# Patient Record
Sex: Female | Born: 1980 | Race: White | Hispanic: No | Marital: Married | Smoking: Never smoker
Health system: Southern US, Community
[De-identification: ages and names within clinical notes are randomized; demographics above are authoritative.]

## PROBLEM LIST (undated history)

## (undated) DIAGNOSIS — F32A Depression, unspecified: Secondary | ICD-10-CM

## (undated) DIAGNOSIS — Z8742 Personal history of other diseases of the female genital tract: Secondary | ICD-10-CM

## (undated) DIAGNOSIS — O24419 Gestational diabetes mellitus in pregnancy, unspecified control: Secondary | ICD-10-CM

## (undated) DIAGNOSIS — F329 Major depressive disorder, single episode, unspecified: Secondary | ICD-10-CM

## (undated) DIAGNOSIS — F419 Anxiety disorder, unspecified: Secondary | ICD-10-CM

## (undated) DIAGNOSIS — I10 Essential (primary) hypertension: Secondary | ICD-10-CM

## (undated) DIAGNOSIS — Z3490 Encounter for supervision of normal pregnancy, unspecified, unspecified trimester: Secondary | ICD-10-CM

## (undated) DIAGNOSIS — R7302 Impaired glucose tolerance (oral): Secondary | ICD-10-CM

## (undated) HISTORY — DX: Personal history of other diseases of the female genital tract: Z87.42

## (undated) HISTORY — DX: Major depressive disorder, single episode, unspecified: F32.9

## (undated) HISTORY — DX: Anxiety disorder, unspecified: F41.9

## (undated) HISTORY — DX: Impaired glucose tolerance (oral): R73.02

## (undated) HISTORY — PX: NO PAST SURGERIES: SHX2092

## (undated) HISTORY — DX: Depression, unspecified: F32.A

## (undated) HISTORY — DX: Morbid (severe) obesity due to excess calories: E66.01

---
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
## 2002-09-15 ENCOUNTER — Other Ambulatory Visit: Admission: RE | Admit: 2002-09-15 | Discharge: 2002-09-15 | Payer: Self-pay | Admitting: Obstetrics and Gynecology

## 2003-05-12 ENCOUNTER — Encounter: Payer: Self-pay | Admitting: Family Medicine

## 2003-05-12 ENCOUNTER — Encounter: Admission: RE | Admit: 2003-05-12 | Discharge: 2003-05-12 | Payer: Self-pay | Admitting: Family Medicine

## 2003-09-27 ENCOUNTER — Other Ambulatory Visit: Admission: RE | Admit: 2003-09-27 | Discharge: 2003-09-27 | Payer: Self-pay | Admitting: Obstetrics and Gynecology

## 2004-12-10 ENCOUNTER — Other Ambulatory Visit: Admission: RE | Admit: 2004-12-10 | Discharge: 2004-12-10 | Payer: Self-pay | Admitting: Obstetrics and Gynecology

## 2005-09-13 ENCOUNTER — Emergency Department (HOSPITAL_COMMUNITY): Admission: EM | Admit: 2005-09-13 | Discharge: 2005-09-13 | Payer: Self-pay | Admitting: Family Medicine

## 2005-09-21 ENCOUNTER — Emergency Department (HOSPITAL_COMMUNITY): Admission: EM | Admit: 2005-09-21 | Discharge: 2005-09-21 | Payer: Self-pay | Admitting: Family Medicine

## 2006-09-17 ENCOUNTER — Other Ambulatory Visit: Admission: RE | Admit: 2006-09-17 | Discharge: 2006-09-17 | Payer: Self-pay | Admitting: Obstetrics & Gynecology

## 2006-12-06 ENCOUNTER — Emergency Department (HOSPITAL_COMMUNITY): Admission: EM | Admit: 2006-12-06 | Discharge: 2006-12-06 | Payer: Self-pay | Admitting: Emergency Medicine

## 2007-07-02 ENCOUNTER — Emergency Department (HOSPITAL_COMMUNITY): Admission: EM | Admit: 2007-07-02 | Discharge: 2007-07-02 | Payer: Self-pay | Admitting: Family Medicine

## 2008-01-14 ENCOUNTER — Other Ambulatory Visit: Admission: RE | Admit: 2008-01-14 | Discharge: 2008-01-14 | Payer: Self-pay | Admitting: Obstetrics and Gynecology

## 2008-05-27 ENCOUNTER — Emergency Department (HOSPITAL_COMMUNITY): Admission: EM | Admit: 2008-05-27 | Discharge: 2008-05-27 | Payer: Self-pay | Admitting: Emergency Medicine

## 2011-12-08 LAB — HM PAP SMEAR

## 2013-02-15 ENCOUNTER — Other Ambulatory Visit: Payer: Self-pay | Admitting: *Deleted

## 2013-04-08 ENCOUNTER — Telehealth: Payer: Self-pay | Admitting: Nurse Practitioner

## 2013-04-08 NOTE — Telephone Encounter (Signed)
Left message on CB#. Instructed patient to call provider on call or go to urgent care. Please call our office on Monday if need appointment.

## 2013-04-08 NOTE — Telephone Encounter (Signed)
Irregular periods and sever cramping.

## 2013-09-19 ENCOUNTER — Encounter: Payer: Self-pay | Admitting: Nurse Practitioner

## 2013-12-16 ENCOUNTER — Ambulatory Visit: Payer: Self-pay | Admitting: Nurse Practitioner

## 2013-12-19 ENCOUNTER — Encounter: Payer: Self-pay | Admitting: Nurse Practitioner

## 2013-12-20 ENCOUNTER — Ambulatory Visit (INDEPENDENT_AMBULATORY_CARE_PROVIDER_SITE_OTHER): Payer: BC Managed Care – PPO | Admitting: Nurse Practitioner

## 2013-12-20 ENCOUNTER — Encounter: Payer: Self-pay | Admitting: Nurse Practitioner

## 2013-12-20 VITALS — BP 122/80 | HR 80 | Ht 70.0 in | Wt 265.0 lb

## 2013-12-20 DIAGNOSIS — Z113 Encounter for screening for infections with a predominantly sexual mode of transmission: Secondary | ICD-10-CM

## 2013-12-20 DIAGNOSIS — Z01419 Encounter for gynecological examination (general) (routine) without abnormal findings: Secondary | ICD-10-CM

## 2013-12-20 DIAGNOSIS — E282 Polycystic ovarian syndrome: Secondary | ICD-10-CM

## 2013-12-20 DIAGNOSIS — N6320 Unspecified lump in the left breast, unspecified quadrant: Secondary | ICD-10-CM

## 2013-12-20 DIAGNOSIS — N63 Unspecified lump in unspecified breast: Secondary | ICD-10-CM

## 2013-12-20 DIAGNOSIS — Z0000 Encounter for general adult medical examination without abnormal findings: Secondary | ICD-10-CM

## 2013-12-20 LAB — POCT URINALYSIS DIPSTICK
Bilirubin, UA: NEGATIVE
Glucose, UA: NEGATIVE
Ketones, UA: NEGATIVE
Leukocytes, UA: NEGATIVE
Nitrite, UA: NEGATIVE
Protein, UA: NEGATIVE
Urobilinogen, UA: NEGATIVE
pH, UA: 5

## 2013-12-20 LAB — HEMOGLOBIN, FINGERSTICK: Hemoglobin, fingerstick: 13.8 g/dL (ref 12.0–16.0)

## 2013-12-20 MED ORDER — NORETHIN ACE-ETH ESTRAD-FE 1-20 MG-MCG PO TABS: 1.0000 | ORAL_TABLET | Freq: Every day | ORAL | Status: DC

## 2013-12-20 NOTE — Progress Notes (Signed)
Patient ID: Tina Diaz, female   DOB: 09-08-1981, 33 y.o.   MRN: [id number] 33 y.o. G0P0 Single Caucasian Fe here for annual exam. Same partner for 9 months but not SA. Menses about 28 - 31 days, flow is moderate to light.  Some cramps every other month.  No PMS.  In past did not do well on Mircette. She would like other test for PCOS - she has not had a TSH done and would like this ordered.  Patient's last menstrual period was 12/19/2013.          Sexually active: yes  The current method of family planning is OCP (estrogen/progesterone).    Exercising: yes  Yoga and cardio 3 times per week Smoker:  no  Health Maintenance: Pap:  12/08/11, WNL TDaP:  2008 Labs:  HB:  13.8 Urine:  Trace RBC (on menses)   reports that she has never smoked. She has never used smokeless tobacco. She reports that she drinks alcohol. She reports that she does not use illicit drugs.  Past Medical History  Diagnosis Date  . History of PCOS     with hirtsutism    No past surgical history on file.  Current Outpatient Prescriptions  Medication Sig Dispense Refill  . norethindrone-ethinyl estradiol (LARIN FE 1/20) 1-20 MG-MCG tablet Take 1 tablet by mouth daily.  3 Package  3   No current facility-administered medications for this visit.    Family History  Problem Relation Age of Onset  . Diabetes Father   . Hypertension Father   . Ovarian cancer Maternal Grandmother 40  . Heart disease Maternal Grandfather   . Diabetes Paternal Grandmother   . Diabetes Paternal Grandfather     ROS:  Pertinent items are noted in HPI.  Otherwise, a comprehensive ROS was negative.  Exam:   BP 122/80  Pulse 80  Ht 5\' 10"  (1.778 m)  Wt 265 lb (120.203 kg)  BMI 38.02 kg/m2  LMP 12/19/2013 Height: 5\' 10"  (177.8 cm)  Ht Readings from Last 3 Encounters:  12/20/13 5\' 10"  (1.778 m)    General appearance: alert, cooperative and appears stated age Head: Normocephalic, without obvious abnormality, atraumatic Neck:  no adenopathy, supple, symmetrical, trachea midline and thyroid normal to inspection and palpation Lungs: clear to auscultation bilaterally Breasts: normal appearance, no masses or tenderness, positive findings: fibrocystic changes and 2 cm, irregular, firm and mobile nodule located on the left 1 inch from the left nipple at 4:30 position Heart: regular rate and rhythm Abdomen: soft, non-tender; no masses,  no organomegaly Extremities: extremities normal, atraumatic, no cyanosis or edema Skin: Skin color, texture, turgor normal. No rashes or lesions Lymph nodes: Cervical, supraclavicular, and axillary nodes normal. No abnormal inguinal nodes palpated Neurologic: Grossly normal   Pelvic: External genitalia:  no lesions              Urethra:  normal appearing urethra with no masses, tenderness or lesions              Bartholin's and Skene's: normal                 Vagina: normal appearing vagina with normal color and discharge, no lesions              Cervix: anteverted              Pap taken: yes Bimanual Exam:  Uterus:  normal size, contour, position, consistency, mobility, non-tender  Adnexa: no mass, fullness, tenderness               Rectovaginal: Confirms               Anus:  normal sphincter tone, no lesions  A:  Well Woman with normal exam  Contraception with OCP  R/O STD's  Mass left breast which may be a cluster of FCB changes at 4:30 position left breast.  P:   Pap smear as per guidelines Done today  Mammogram diagnostic or [place] is scheduled  Refill OCP - Loestrin for a year  Call with STD's, and TSH - next year will come in fasting and will check a fasting insulin level.  Counseled on breast self exam, STD prevention, use and side effects of OCP's, adequate intake of calcium and vitamin D, diet and exercise return annually or prn  An After Visit Summary was printed and given to the patient.

## 2013-12-20 NOTE — Progress Notes (Signed)
Encounter reviewed by Dr. Elaine Roanhorse Silva.  

## 2013-12-20 NOTE — Patient Instructions (Signed)

## 2013-12-21 LAB — STD PANEL
HIV: NONREACTIVE
Hepatitis B Surface Ag: NEGATIVE

## 2013-12-21 LAB — TSH: TSH: 1.43 u[IU]/mL (ref 0.350–4.500)

## 2013-12-23 LAB — IPS PAP TEST WITH REFLEX TO HPV

## 2013-12-23 LAB — IPS N GONORRHOEA AND CHLAMYDIA BY PCR

## 2013-12-26 ENCOUNTER — Telehealth: Payer: Self-pay | Admitting: Nurse Practitioner

## 2013-12-26 NOTE — Telephone Encounter (Signed)
Patient states she is returning Tina Diaz's call. No open phone note so routing call to triage.

## 2013-12-26 NOTE — Telephone Encounter (Signed)
Spoke with patient. Message from AshlandPatricia Rolen-Grubb, FNP as seen below given. Patient verbalizes understanding and is agreeable.  Routing to provider for final review. Patient agreeable to disposition. Will close encounter     Notes Recorded by Luisa DagoStephanie C Phillips, CMA on 12/26/2013 at 10:04 AM I have attempted to contact this patient by phone with the following results: left message to return my call on answering machine (home/mobile per DPR). 02 Recall entered 12/21/14 ------  Notes Recorded by Lauro FranklinPatricia Rolen-Grubb, FNP on 12/26/2013 at 7:58 AM Let patietn know that all STD's with GC, Chl, HIV,STS, Hep B are negative. Her TSH is also normal. And pap is normal 02

## 2013-12-28 ENCOUNTER — Ambulatory Visit
Admission: RE | Admit: 2013-12-28 | Discharge: 2013-12-28 | Disposition: A | Discharge status: Home / Self Care | Payer: BC Managed Care – PPO | Source: Ambulatory Visit | Attending: Nurse Practitioner | Admitting: Nurse Practitioner

## 2013-12-28 DIAGNOSIS — N6320 Unspecified lump in the left breast, unspecified quadrant: Secondary | ICD-10-CM

## 2014-10-30 ENCOUNTER — Other Ambulatory Visit: Payer: Self-pay | Admitting: *Deleted

## 2014-10-30 MED ORDER — NORETHIN ACE-ETH ESTRAD-FE 1-20 MG-MCG PO TABS: 1.0000 | ORAL_TABLET | Freq: Every day | ORAL | Status: DC

## 2014-10-30 NOTE — Telephone Encounter (Signed)
Medication refill request: Junel 1/20  Last AEX:  12/20/13 with Ms. Patty  Next AEX: 12/26/14 with Ms. Patty Last MMG (if hormonal medication request): MMG/Ultrasound 12/28/13 Bi-Rads 1: Negative Refill authorized: #3 packs

## 2014-12-26 ENCOUNTER — Ambulatory Visit: Payer: BC Managed Care – PPO | Admitting: Nurse Practitioner

## 2014-12-26 ENCOUNTER — Encounter: Payer: Self-pay | Admitting: Nurse Practitioner

## 2015-01-26 ENCOUNTER — Other Ambulatory Visit: Payer: Self-pay | Admitting: Nurse Practitioner

## 2015-01-26 MED ORDER — NORETHIN ACE-ETH ESTRAD-FE 1-20 MG-MCG PO TABS: 1.0000 | ORAL_TABLET | Freq: Every day | ORAL | Status: DC

## 2015-01-26 NOTE — Telephone Encounter (Signed)
Patient is requesting a refill of birth control. Pharmacy on file.

## 2015-01-26 NOTE — Telephone Encounter (Signed)
Medication refill request: OCP Last AEX:  12/20/13 PG Next AEX: 02/08/15 PG Last MMG (if hormonal medication request): 12/28/13 US Left BIRADS1:Neg Refill authorized: 10/30/14 #3packs/0R. Today #1pack/0R?

## 2015-02-08 ENCOUNTER — Encounter: Payer: Self-pay | Admitting: Nurse Practitioner

## 2015-02-08 ENCOUNTER — Ambulatory Visit (INDEPENDENT_AMBULATORY_CARE_PROVIDER_SITE_OTHER): Payer: BC Managed Care – PPO | Admitting: Nurse Practitioner

## 2015-02-08 VITALS — BP 122/76 | HR 84 | Ht 70.25 in | Wt 290.0 lb

## 2015-02-08 DIAGNOSIS — Z01419 Encounter for gynecological examination (general) (routine) without abnormal findings: Secondary | ICD-10-CM

## 2015-02-08 DIAGNOSIS — E282 Polycystic ovarian syndrome: Secondary | ICD-10-CM

## 2015-02-08 DIAGNOSIS — N912 Amenorrhea, unspecified: Secondary | ICD-10-CM | POA: Diagnosis not present

## 2015-02-08 DIAGNOSIS — Z0000 Encounter for general adult medical examination without abnormal findings: Secondary | ICD-10-CM

## 2015-02-08 LAB — POCT URINALYSIS DIPSTICK
Bilirubin, UA: NEGATIVE
Blood, UA: NEGATIVE
Glucose, UA: NEGATIVE
Ketones, UA: NEGATIVE
Leukocytes, UA: NEGATIVE
Nitrite, UA: NEGATIVE
Protein, UA: NEGATIVE
Urobilinogen, UA: NEGATIVE
pH, UA: 6

## 2015-02-08 LAB — HEMOGLOBIN, FINGERSTICK: Hemoglobin, fingerstick: 13.6 g/dL (ref 12.0–16.0)

## 2015-02-08 LAB — POCT URINE PREGNANCY: Preg Test, Ur: NEGATIVE

## 2015-02-08 MED ORDER — MEDROXYPROGESTERONE ACETATE 10 MG PO TABS: 10.0000 mg | ORAL_TABLET | Freq: Every day | ORAL | Status: DC

## 2015-02-08 MED ORDER — IBUPROFEN 800 MG PO TABS
800.0000 mg | ORAL_TABLET | Freq: Three times a day (TID) | ORAL | Status: DC | PRN
Start: 2015-02-08 — End: 2017-01-07

## 2015-02-08 MED ORDER — NORETHIN ACE-ETH ESTRAD-FE 1-20 MG-MCG PO TABS: 1.0000 | ORAL_TABLET | Freq: Every day | ORAL | Status: DC

## 2015-02-08 NOTE — Progress Notes (Signed)
Patient ID: Tina Diaz, female   DOB: 09-19-1981, 34 y.o.   MRN: [id number] 34 y.o. G0P0 Single  Caucasian Fe here for annual exam.  Had LMP 12/08/14, she then had several illness and deaths in the family.  She was with MGM who was with Hospice care for a month with biliary cancer before she passed.  Then paternal aunt died suddenly age 32 of ? heart or CVA.  She has been on her OCP and compliant to use.  After this last pack of pills did not restart new pack and is waiting for menses to start to start a new pack.  Not SA in a year. She would like to have more test done for PCOS - she did not return for fasting insulin last year.  Patient's last menstrual period was 12/08/2014 (exact date).          Sexually active:  No. The current method of family planning is OCP (estrogen/progesterone).    Exercising: Yes.    walking and weights Smoker:  no  Health Maintenance: Pap:  12/20/13, negative MMG:  12/28/13 diagnostic with ultrasound, Bi-Rads 1:  Negative, screening MMG at age 34 TDaP:  2008 Labs:  HB:  13.6  Urine:  Negative   UPT: neg   reports that she has never smoked. She has never used smokeless tobacco. She reports that she drinks alcohol. She reports that she does not use illicit drugs.  Past Medical History  Diagnosis Date  . History of PCOS     with hirtsutism    History reviewed. No pertinent past surgical history.  Current Outpatient Prescriptions  Medication Sig Dispense Refill  . norethindrone-ethinyl estradiol (LARIN FE 1/20) 1-20 MG-MCG tablet Take 1 tablet by mouth daily. 3 Package 3  . sertraline (ZOLOFT) 50 MG tablet Take 50 mg by mouth daily.    Marland Kitchen. ibuprofen (ADVIL,MOTRIN) 800 MG tablet Take 1 tablet (800 mg total) by mouth every 8 (eight) hours as needed. 30 tablet 1  . medroxyPROGESTERone (PROVERA) 10 MG tablet Take 1 tablet (10 mg total) by mouth daily. 10 tablet 0   No current facility-administered medications for this visit.    Family History  Problem  Relation Age of Onset  . Diabetes Father   . Hypertension Father   . Ovarian cancer Maternal Grandmother 40  . Cancer Maternal Grandmother     bile duct  . Heart disease Maternal Grandfather   . Diabetes Paternal Grandmother   . Diabetes Paternal Grandfather     ROS:  Pertinent items are noted in HPI.  Otherwise, a comprehensive ROS was negative.  Exam:   BP 122/76 mmHg  Pulse 84  Ht 5' 10.25" (1.784 m)  Wt 290 lb (131.543 kg)  BMI 41.33 kg/m2  LMP 12/08/2014 (Exact Date) Height: 5' 10.25" (178.4 cm) Ht Readings from Last 3 Encounters:  02/08/15 5' 10.25" (1.784 m)  12/20/13 5\' 10"  (1.778 m)    General appearance: alert, cooperative and appears stated age Head: Normocephalic, without obvious abnormality, atraumatic Neck: no adenopathy, supple, symmetrical, trachea midline and thyroid normal to inspection and palpation Lungs: clear to auscultation bilaterally Breasts: normal appearance, no masses or tenderness Heart: regular rate and rhythm Abdomen: soft, non-tender; no masses,  no organomegaly Extremities: extremities normal, atraumatic, no cyanosis or edema Skin: Skin color, texture, turgor normal. No rashes or lesions Lymph nodes: Cervical, supraclavicular, and axillary nodes normal. No abnormal inguinal nodes palpated Neurologic: Grossly normal   Pelvic: External genitalia:  no lesions  Urethra:  normal appearing urethra with no masses, tenderness or lesions              Bartholin's and Skene's: normal                 Vagina: normal appearing vagina with normal color and discharge, no lesions              Cervix: anteverted              Pap taken: No. Bimanual Exam:  Uterus:  normal size, contour, position, consistency, mobility, non-tender              Adnexa: no mass, fullness, tenderness               Rectovaginal: Confirms               Anus:  normal sphincter tone, no lesions  Chaperone present:  yes  A:  Well Woman with normal  exam  Contraception with OCP with current amenorrhea off OCP  History of PCOS  P:   Reviewed health and wellness pertinent to exam  Pap smear not taken today  Will start her on Provera challenge to initiate a withdrawal bleed then restart OCP  Will follow with fasting labs when she returns  Counseled on breast self exam, use and side effects of OCP's, adequate intake of calcium and vitamin D, diet and exercise return annually or prn  An After Visit Summary was printed and given to the patient.

## 2015-02-08 NOTE — Patient Instructions (Signed)

## 2015-02-18 NOTE — Progress Notes (Signed)
Encounter reviewed by Dr. Winry Egnew Silva.  

## 2015-02-22 ENCOUNTER — Other Ambulatory Visit (INDEPENDENT_AMBULATORY_CARE_PROVIDER_SITE_OTHER): Payer: BC Managed Care – PPO

## 2015-02-22 DIAGNOSIS — E282 Polycystic ovarian syndrome: Secondary | ICD-10-CM

## 2015-02-23 ENCOUNTER — Telehealth: Payer: Self-pay | Admitting: *Deleted

## 2015-02-23 LAB — COMPREHENSIVE METABOLIC PANEL
ALT: 17 U/L (ref 0–35)
AST: 19 U/L (ref 0–37)
Albumin: 3.9 g/dL (ref 3.5–5.2)
Alkaline Phosphatase: 75 U/L (ref 39–117)
BUN: 12 mg/dL (ref 6–23)
CO2: 24 mEq/L (ref 19–32)
Calcium: 9.1 mg/dL (ref 8.4–10.5)
Chloride: 104 mEq/L (ref 96–112)
Creat: 0.75 mg/dL (ref 0.50–1.10)
Glucose, Bld: 104 mg/dL — ABNORMAL HIGH (ref 70–99)
Potassium: 4.4 mEq/L (ref 3.5–5.3)
Sodium: 137 mEq/L (ref 135–145)
Total Bilirubin: 1 mg/dL (ref 0.2–1.2)
Total Protein: 6.8 g/dL (ref 6.0–8.3)

## 2015-02-23 LAB — LIPID PANEL
Cholesterol: 177 mg/dL (ref 0–200)
HDL: 68 mg/dL (ref >=46)
LDL Cholesterol: 93 mg/dL (ref 0–99)
Total CHOL/HDL Ratio: 2.6 Ratio
Triglycerides: 78 mg/dL (ref <150)
VLDL: 16 mg/dL (ref 0–40)

## 2015-02-23 LAB — VITAMIN D 25 HYDROXY (VIT D DEFICIENCY, FRACTURES): Vit D, 25-Hydroxy: 27 ng/mL — ABNORMAL LOW (ref 30–100)

## 2015-02-23 LAB — INSULIN, FASTING: Insulin fasting, serum: 6.9 u[IU]/mL (ref 2.0–19.6)

## 2015-02-23 NOTE — Telephone Encounter (Signed)
-----   Message from Ria CommentPatricia Grubb, FNP sent at 02/23/2015  8:08 AM EDT ----- Let pt. Know that Lipid panel is normal.  The CMP is normal except for slight elevated glucose but the fasting insulin was very normal.  The Vit D level was low and needs to start Vit D per protocol.  While I still believe she has PCOS the labs look very good and from a metabolic view she is not at risk for any targeted organ damage at this time ie: ( liver, kidney, pancreas ad heart)

## 2015-02-23 NOTE — Telephone Encounter (Signed)
Left Message To Call Back  

## 2015-02-23 NOTE — Telephone Encounter (Signed)
Patient notified- see lab results 

## 2015-03-19 ENCOUNTER — Telehealth: Payer: Self-pay | Admitting: Nurse Practitioner

## 2015-03-19 NOTE — Telephone Encounter (Signed)
Left message on voicemail for pt to call regarding release form. No record of immunizations done at this office to send to [facility] physicians.

## 2015-03-21 ENCOUNTER — Telehealth: Payer: Self-pay | Admitting: Nurse Practitioner

## 2015-03-21 NOTE — Telephone Encounter (Signed)
Returning a call to [place]. Only need most recent tdap sent.

## 2015-03-22 NOTE — Telephone Encounter (Signed)
Will send documentation of when she had tdap done in 2008.

## 2015-08-16 ENCOUNTER — Telehealth: Payer: Self-pay | Admitting: Nurse Practitioner

## 2015-08-16 NOTE — Telephone Encounter (Signed)
Patient is having breakthrough bleeding on her birth control. Says she was told to call and speak with Patty if she ever had any problems.

## 2015-08-16 NOTE — Telephone Encounter (Signed)
Message left to return call to Jerone Cudmore at [phone].    

## 2015-08-31 NOTE — Telephone Encounter (Signed)
Message left to return call to Kendricks Reap at [phone].    

## 2015-09-05 NOTE — Telephone Encounter (Signed)
OK to close the encounter

## 2015-09-05 NOTE — Telephone Encounter (Signed)
Message left to return call to Ivie Savitt at [phone].    

## 2015-09-05 NOTE — Telephone Encounter (Signed)
Tina Diaz,  I have attempted to reach this patient 3 times without response.  Okay to close encounter?

## 2015-12-25 ENCOUNTER — Other Ambulatory Visit: Payer: Self-pay | Admitting: Family Medicine

## 2015-12-25 DIAGNOSIS — J3489 Other specified disorders of nose and nasal sinuses: Secondary | ICD-10-CM

## 2015-12-31 ENCOUNTER — Ambulatory Visit
Admission: RE | Admit: 2015-12-31 | Discharge: 2015-12-31 | Disposition: A | Discharge status: Home / Self Care | Payer: BC Managed Care – PPO | Source: Ambulatory Visit | Attending: Family Medicine | Admitting: Family Medicine

## 2015-12-31 DIAGNOSIS — J3489 Other specified disorders of nose and nasal sinuses: Secondary | ICD-10-CM

## 2016-02-13 ENCOUNTER — Ambulatory Visit: Payer: BC Managed Care – PPO | Admitting: Nurse Practitioner

## 2016-03-19 ENCOUNTER — Telehealth: Payer: Self-pay | Admitting: Nurse Practitioner

## 2016-03-19 NOTE — Telephone Encounter (Signed)
Patient wants to talk with the nurse she has not had a cycle and is trying to get pregnant.

## 2016-03-19 NOTE — Telephone Encounter (Signed)
Return call to patient. Last office visit 02-08-15 for annual exam.  States went off oral contraceptive pills in Jan 2017. Has been having cycles 28-29 days. Currently 8-10 days late with negative home UPT. History of PCOS and periods of amenorrhea in past. Discussed option of waiting a few more days versus office visit. Appointment scheduled for 03-21-16 with Shirlyn GoltzPatty Grubb, FNP to assess.   Routing to provider for final review. Patient agreeable to disposition. Will close encounter.

## 2016-03-21 ENCOUNTER — Ambulatory Visit (INDEPENDENT_AMBULATORY_CARE_PROVIDER_SITE_OTHER): Payer: BC Managed Care – PPO | Admitting: Nurse Practitioner

## 2016-03-21 ENCOUNTER — Encounter: Payer: Self-pay | Admitting: Nurse Practitioner

## 2016-03-21 VITALS — BP 122/86 | HR 80 | Resp 16 | Ht 70.25 in | Wt 322.0 lb

## 2016-03-21 DIAGNOSIS — N912 Amenorrhea, unspecified: Secondary | ICD-10-CM

## 2016-03-21 DIAGNOSIS — N926 Irregular menstruation, unspecified: Secondary | ICD-10-CM | POA: Diagnosis not present

## 2016-03-21 LAB — FSH/LH
FSH: 4.1 m[IU]/mL
LH: 4.9 m[IU]/mL

## 2016-03-21 LAB — HCG, SERUM, QUALITATIVE: Preg, Serum: NEGATIVE

## 2016-03-21 LAB — POCT URINE PREGNANCY: Preg Test, Ur: NEGATIVE

## 2016-03-21 MED ORDER — MEDROXYPROGESTERONE ACETATE 10 MG PO TABS: 10.0000 mg | ORAL_TABLET | Freq: Every day | ORAL | Status: DC

## 2016-03-21 NOTE — Progress Notes (Signed)
35 y.o. Married Caucasian female G0P0 here for follow up of amenorrhea. LMP 02/11/16 with normal flow.  She did have light brown spotting yesterday, without any today.   She got married last October and they have been planning a pregnancy so came off OCP in January.  Not actively trying but not preventing either.  Since off OCP first few cycles were normal and then became further apart.  Cycles now at about  30- 45 days.   She has a working history of PCOS - but never proven with PUS.  She had fasting insulin and TSH done last year.  She would like to pursue more test and a consult about pregnancy issues. She is having a weight gain that she can not control. She is having increased problems with unwanted hair.      O: Healthy WD,WN female Affect: normal Abdomen:soft, non tender, normal bowel sounds Pelvic exam:EXTERNAL GENITALIA: normal appearing vulva with no masses, tenderness or lesions VAGINA: no abnormal discharge or lesions and scant blood  CERVIX: no lesions or cervical motion tenderness UTERUS: gravid and anteverted  A: Amenorrhea with negative UPT  Working diagnosis of PCOS   P:  Discussed possible treatment of PCOS with Glucophage  She may need Clomid for pregnancy  Current spotting may be from menses vs early pregnancy  Will get a serum pregnancy and follow - if negative will give her Provera 10 mg X 5 days and expect a withdrawal bleeding.     Labs : as noted  Instructions given regarding:  She and husband will return and have a consult with Dr. Edward JollySilva regarding future pregnancy concerns and possible treatment.  RV

## 2016-03-22 LAB — DHEA-SULFATE: DHEA-SO4: 69 ug/dL (ref 23–266)

## 2016-03-23 NOTE — Progress Notes (Signed)
Encounter reviewed by Dr. Brook Amundson C. Silva.  

## 2016-03-24 LAB — 17-HYDROXYPROGESTERONE: 17-OH-Progesterone, LC/MS/MS: 80 ng/dL

## 2016-03-25 LAB — TESTOSTERONE, TOTAL, LC/MS/MS: Testosterone, Total, LC-MS-MS: 36 ng/dL (ref 2–45)

## 2016-03-28 LAB — ESTRADIOL, FREE
Estradiol, Free: 1.4 pg/mL
Estradiol: 83 pg/mL

## 2016-04-03 ENCOUNTER — Telehealth: Payer: Self-pay | Admitting: *Deleted

## 2016-04-03 NOTE — Telephone Encounter (Signed)
Left message to call Shooter Tangen [phone].  

## 2016-04-03 NOTE — Telephone Encounter (Signed)
-----   Message from Ria CommentPatricia Grubb, FNP sent at 03/28/2016  3:38 PM EDT ----- Please let pt know that the hormone test we checked:  estrogen, testosterone, progesterone, DHEA, FSH/LH levels are all normal.  Get a progress report on withdrawal from Provera.

## 2016-04-08 NOTE — Telephone Encounter (Signed)
Patient returned call. Notified of lab results. Patient stated she never started the provera because she started bleeding "naturally" on 03/21/2016. RN called patient back and left message per DPR letting patient know to call office back if she misses three consecutive cycles. Instructed patient to call office with any questions.    Routing to provider for final review. Will close encounter.

## 2016-04-11 ENCOUNTER — Telehealth: Payer: Self-pay | Admitting: Obstetrics and Gynecology

## 2016-04-11 ENCOUNTER — Ambulatory Visit: Payer: BC Managed Care – PPO | Admitting: Obstetrics and Gynecology

## 2016-04-11 NOTE — Telephone Encounter (Signed)
Patient cancelled her appointment today because she is sick with a stomach bug. Will call back to reschedule when she's feeling better.

## 2016-04-11 NOTE — Telephone Encounter (Signed)
I am forwarding less message to Shirlyn GoltzPatty Grubb, the patient's primary care giver.

## 2016-04-13 ENCOUNTER — Other Ambulatory Visit: Payer: Self-pay | Admitting: Nurse Practitioner

## 2016-04-14 NOTE — Telephone Encounter (Signed)
Medication refill request: Blisovi Fe 1/20 Last AEX:  512!6 PG Next AEX: 05/23/16 Last MMG (if hormonal medication request): 12/28/13 BIRADS1; 12/28/13 Dx L Axilla BIRADS1 Refill authorized: 02/08/15 #3 Packages 3R. Patient discontinued Rx back in January. Was not preventing pregnancy at that time.

## 2016-04-18 ENCOUNTER — Ambulatory Visit: Payer: BC Managed Care – PPO | Admitting: Nurse Practitioner

## 2016-05-23 ENCOUNTER — Encounter: Payer: Self-pay | Admitting: Nurse Practitioner

## 2016-05-23 ENCOUNTER — Ambulatory Visit (INDEPENDENT_AMBULATORY_CARE_PROVIDER_SITE_OTHER): Payer: BC Managed Care – PPO | Admitting: Nurse Practitioner

## 2016-05-23 VITALS — BP 122/80 | HR 64 | Ht 70.0 in | Wt 329.0 lb

## 2016-05-23 DIAGNOSIS — Z01419 Encounter for gynecological examination (general) (routine) without abnormal findings: Secondary | ICD-10-CM | POA: Diagnosis not present

## 2016-05-23 DIAGNOSIS — Z0000 Encounter for general adult medical examination without abnormal findings: Secondary | ICD-10-CM

## 2016-05-23 LAB — POCT URINALYSIS DIPSTICK
Bilirubin, UA: NEGATIVE
Blood, UA: NEGATIVE
Glucose, UA: NEGATIVE
Ketones, UA: NEGATIVE
Leukocytes, UA: NEGATIVE
Nitrite, UA: NEGATIVE
Protein, UA: NEGATIVE
Urobilinogen, UA: NEGATIVE
pH, UA: 6

## 2016-05-23 MED ORDER — NORETHIN ACE-ETH ESTRAD-FE 1-20 MG-MCG PO TABS: 1.0000 | ORAL_TABLET | Freq: Every day | ORAL | 4 refills | Status: DC

## 2016-05-23 NOTE — Progress Notes (Signed)
Patient ID: Tina Diaz, female   DOB: 11/04/1980, 35 y.o.   MRN: [id number]  35 y.o. G0P0000 Married  Caucasian Fe here for annual exam.  Menses off OCP was irregular at 28 - 45 days.  Now back on OCP for a month.  Plans are to stay on OCP for 5-6 months and then come off and try again for pregnancy.  If no success will make apt to see Dr. Hyacinth Meeker with other options. Her previous apt with Dr. Hyacinth Meeker canceled due to illness.  Patient's last menstrual period was 04/25/2016.          Sexually active: Yes.    The current method of family planning is OCP (estrogen/progesterone).    Exercising: Yes.    Crossfit and yoga Smoker:  no  Health Maintenance: Pap:  12/20/13, negative MMG:  12/28/13 diagnostic with ultrasound, Bi-Rads 1:  Negative, screening MMG at age 58 TDaP:  2008 HIV: 12/20/13 Labs: PCP  Urine: negative   reports that she has never smoked. She has never used smokeless tobacco. She reports that she drinks alcohol. She reports that she does not use drugs.  Past Medical History:  Diagnosis Date  . History of PCOS    with hirtsutism    History reviewed. No pertinent surgical history.  Current Outpatient Prescriptions  Medication Sig Dispense Refill  . BLISOVI FE 1/20 1-20 MG-MCG tablet TAKE 1 TABLET BY MOUTH DAILY. 84 tablet 0  . fluticasone (FLONASE) 50 MCG/ACT nasal spray Place 1 spray into both nostrils as needed.  12  . ibuprofen (ADVIL,MOTRIN) 800 MG tablet Take 1 tablet (800 mg total) by mouth every 8 (eight) hours as needed. 30 tablet 1  . sertraline (ZOLOFT) 100 MG tablet Take 1 tablet by mouth daily.     No current facility-administered medications for this visit.     Family History  Problem Relation Age of Onset  . Diabetes Father   . Hypertension Father   . Ovarian cancer Maternal Grandmother 40  . Cancer Maternal Grandmother     bile duct  . Heart disease Maternal Grandfather   . Diabetes Paternal Grandmother   . Diabetes Paternal Grandfather     ROS:   Pertinent items are noted in HPI.  Otherwise, a comprehensive ROS was negative.  Exam:   BP 122/80 (BP Location: Right Arm, Patient Position: Sitting, Cuff Size: Large)   Pulse 64   Ht 5\' 10"  (1.778 m)   Wt (!) 329 lb (149.2 kg)   LMP 04/25/2016   BMI 47.21 kg/m  Height: 5\' 10"  (177.8 cm) Ht Readings from Last 3 Encounters:  05/23/16 5\' 10"  (1.778 m)  03/21/16 5' 10.25" (1.784 m)  02/08/15 5' 10.25" (1.784 m)    General appearance: alert, cooperative and appears stated age Head: Normocephalic, without obvious abnormality, atraumatic Neck: no adenopathy, supple, symmetrical, trachea midline and thyroid normal to inspection and palpation Lungs: clear to auscultation bilaterally Breasts: normal appearance, no masses or tenderness Heart: regular rate and rhythm Abdomen: soft, non-tender; no masses,  no organomegaly Extremities: extremities normal, atraumatic, no cyanosis or edema Skin: Skin color, texture, turgor normal. No rashes or lesions Lymph nodes: Cervical, supraclavicular, and axillary nodes normal. No abnormal inguinal nodes palpated Neurologic: Grossly normal   Pelvic: External genitalia:  no lesions              Urethra:  normal appearing urethra with no masses, tenderness or lesions  Bartholin's and Skene's: normal                 Vagina: normal appearing vagina with normal color and discharge, no lesions              Cervix: anteverted              Pap taken: No. Bimanual Exam:  Uterus:  normal size, contour, position, consistency, mobility, non-tender              Adnexa: no mass, fullness, tenderness               Rectovaginal: Confirms               Anus:  normal sphincter tone, no lesions  Chaperone present: yes  A:  Well Woman with normal exam  Contraception with OCP              History of PCOS - labs done 02/2016 were all normal  Plans are to try again for pregnancy in 5-6 months       P:   Reviewed health and wellness pertinent to  exam  Pap smear as above  counseled on breast self exam, use and side effects of OCP's, adequate intake of calcium and vitamin D, diet and exercise return annually or prn  An After Visit Summary was printed and given to the patient.

## 2016-05-23 NOTE — Progress Notes (Signed)
Reviewed personally.  M. Suzanne Kingstin Heims, MD.  

## 2016-05-23 NOTE — Patient Instructions (Signed)

## 2016-05-27 LAB — IPS PAP TEST WITH HPV

## 2016-11-03 ENCOUNTER — Telehealth: Payer: Self-pay | Admitting: Obstetrics and Gynecology

## 2016-11-03 NOTE — Telephone Encounter (Signed)
Left message to call Ellieana Dolecki at [phone].  

## 2016-11-03 NOTE — Telephone Encounter (Signed)
Spoke with patient. Patient states she would like to come in for blood pregnancy test and/or UPT. Patient states LMP 09/30/16 and is 5 days late. Patient states home UPT is negative, but would like confirmation. Patient states she did experience small amount of spotting on 2/2. Patient reports not using any contraceptives.  Patient states she would like to come in on Thursday if possible. Advised patient Ria Commentatricia Grubb, NP is out of the office on Thursdays, can schedule with covering provider. Patient scheduled with Leota SauersDeborah Leonard, CNM 11/06/16 at 10am. Patient is agreeable to date and time. Last AEX 05/23/16.  Routing to provider for final review. Patient is agreeable to disposition. Will close encounter.  Cc: Leota Sauerseborah Leonard, CNM

## 2016-11-03 NOTE — Telephone Encounter (Signed)
Patient wants to have a blood pregnancy test done.

## 2016-11-06 ENCOUNTER — Telehealth: Payer: Self-pay | Admitting: Certified Nurse Midwife

## 2016-11-06 ENCOUNTER — Ambulatory Visit: Payer: Self-pay | Admitting: Certified Nurse Midwife

## 2016-11-06 NOTE — Telephone Encounter (Signed)
Patient canceled today's appointment due to flu-like symptoms.  She will call back to reschedule when she is feeling better.

## 2017-01-07 ENCOUNTER — Ambulatory Visit (INDEPENDENT_AMBULATORY_CARE_PROVIDER_SITE_OTHER): Payer: BC Managed Care – PPO | Admitting: Nurse Practitioner

## 2017-01-07 ENCOUNTER — Encounter: Payer: Self-pay | Admitting: Nurse Practitioner

## 2017-01-07 ENCOUNTER — Telehealth: Payer: Self-pay | Admitting: *Deleted

## 2017-01-07 ENCOUNTER — Encounter: Payer: Self-pay | Admitting: *Deleted

## 2017-01-07 VITALS — BP 110/74 | HR 68 | Ht 70.0 in | Wt 335.0 lb

## 2017-01-07 DIAGNOSIS — N912 Amenorrhea, unspecified: Secondary | ICD-10-CM | POA: Diagnosis not present

## 2017-01-07 LAB — POCT URINE PREGNANCY: Preg Test, Ur: POSITIVE — AB

## 2017-01-07 NOTE — Telephone Encounter (Signed)
-----   Message from [name], FNP sent at 01/07/2017 12:31 PM EDT ----- Please give pt a call and verify that she has started prenatal MVI

## 2017-01-07 NOTE — Progress Notes (Signed)
36 y.o. Married Caucasian female G1P0000 here for amenorrhea. LMP: 12/07/16 She has been off OCP since November.  At her AEX 05/23/16 her plans were to stay on OCP for 5-6 months then come off to try for a pregnancy again.  Then stopped OCP in November when PCP put her on Metformin.  Since then menses returned to being slight abnormal at 35 days.  She then a 'chemical' pregnancy in February.    Normally of OCP menses was irregular at 28 - 45 days secondary to diagnosis of PCOS.   Now back to 35 days cycle.  Denies any symptoms of vaginal bleeding or pain. She does want a serum HCG.    O:  Healthy WD,WN female present with her husband   No distress   A: Amenorrhea  Positive UPT  Pregnancy with very early loss in 10/2016    P:  Discussed findings of positive test with ED[facility] of 09/13/17  Will get PUS and follow  She will start on prenatal MVI  Will DC Zoloft  Labs :  Serum HCG  Instructions given regarding: exercise, diet, and precautions.  She will get update TDaP later.  RV Consult time with pt and husband at 15 minutes face to face.

## 2017-01-07 NOTE — Telephone Encounter (Addendum)
Patient states she is taking prenatal vitamin and has been for several months now.  One A Day with DHEA and Folic Acid.  Medication list corrected.  Pregnancy history updated.  Routing to provider for review. Closing encounter.

## 2017-01-07 NOTE — Patient Instructions (Signed)
First Trimester of Pregnancy The first trimester of pregnancy is from week 1 until the end of week 13 (months 1 through 3). A week after a sperm fertilizes an egg, the egg will implant on the wall of the uterus. This embryo will begin to develop into a baby. Genes from you and your partner will form the baby. The female genes will determine whether the baby will be a boy or a girl. At 6-8 weeks, the eyes and face will be formed, and the heartbeat can be seen on ultrasound. At the end of 12 weeks, all the baby's organs will be formed. Now that you are pregnant, you will want to do everything you can to have a healthy baby. Two of the most important things are to get good prenatal care and to follow your health care provider's instructions. Prenatal care is all the medical care you receive before the baby's birth. This care will help prevent, find, and treat any problems during the pregnancy and childbirth. Body changes during your first trimester Your body goes through many changes during pregnancy. The changes vary from woman to woman.  You may gain or lose a couple of pounds at first.  You may feel sick to your stomach (nauseous) and you may throw up (vomit). If the vomiting is uncontrollable, call your health care provider.  You may tire easily.  You may develop headaches that can be relieved by medicines. All medicines should be approved by your health care provider.  You may urinate more often. Painful urination may mean you have a bladder infection.  You may develop heartburn as a result of your pregnancy.  You may develop constipation because certain hormones are causing the muscles that push stool through your intestines to slow down.  You may develop hemorrhoids or swollen veins (varicose veins).  Your breasts may begin to grow larger and become tender. Your nipples may stick out more, and the tissue that surrounds them (areola) may become darker.  Your gums may bleed and may be  sensitive to brushing and flossing.  Dark spots or blotches (chloasma, mask of pregnancy) may develop on your face. This will likely fade after the baby is born.  Your menstrual periods will stop.  You may have a loss of appetite.  You may develop cravings for certain kinds of food.  You may have changes in your emotions from day to day, such as being excited to be pregnant or being concerned that something may go wrong with the pregnancy and baby.  You may have more vivid and strange dreams.  You may have changes in your hair. These can include thickening of your hair, rapid growth, and changes in texture. Some women also have hair loss during or after pregnancy, or hair that feels dry or thin. Your hair will most likely return to normal after your baby is born.  What to expect at prenatal visits During a routine prenatal visit:  You will be weighed to make sure you and the baby are growing normally.  Your blood pressure will be taken.  Your abdomen will be measured to track your baby's growth.  The fetal heartbeat will be listened to between weeks 10 and 14 of your pregnancy.  Test results from any previous visits will be discussed.  Your health care provider may ask you:  How you are feeling.  If you are feeling the baby move.  If you have had any abnormal symptoms, such as leaking fluid, bleeding, severe headaches,   or abdominal cramping.  If you are using any tobacco products, including cigarettes, chewing tobacco, and electronic cigarettes.  If you have any questions.  Other tests that may be performed during your first trimester include:  Blood tests to find your blood type and to check for the presence of any previous infections. The tests will also be used to check for low iron levels (anemia) and protein on red blood cells (Rh antibodies). Depending on your risk factors, or if you previously had diabetes during pregnancy, you may have tests to check for high blood  sugar that affects pregnant women (gestational diabetes).  Urine tests to check for infections, diabetes, or protein in the urine.  An ultrasound to confirm the proper growth and development of the baby.  Fetal screens for spinal cord problems (spina bifida) and Down syndrome.  HIV (human immunodeficiency virus) testing. Routine prenatal testing includes screening for HIV, unless you choose not to have this test.  You may need other tests to make sure you and the baby are doing well.  Follow these instructions at home: Medicines  Follow your health care provider's instructions regarding medicine use. Specific medicines may be either safe or unsafe to take during pregnancy.  Take a prenatal vitamin that contains at least 600 micrograms (mcg) of folic acid.  If you develop constipation, try taking a stool softener if your health care provider approves. Eating and drinking  Eat a balanced diet that includes fresh fruits and vegetables, whole grains, good sources of protein such as meat, eggs, or tofu, and low-fat dairy. Your health care provider will help you determine the amount of weight gain that is right for you.  Avoid raw meat and uncooked cheese. These carry germs that can cause birth defects in the baby.  Eating four or five small meals rather than three large meals a day may help relieve nausea and vomiting. If you start to feel nauseous, eating a few soda crackers can be helpful. Drinking liquids between meals, instead of during meals, also seems to help ease nausea and vomiting.  Limit foods that are high in fat and processed sugars, such as fried and sweet foods.  To prevent constipation: ? Eat foods that are high in fiber, such as fresh fruits and vegetables, whole grains, and beans. ? Drink enough fluid to keep your urine clear or pale yellow. Activity  Exercise only as directed by your health care provider. Most women can continue their usual exercise routine during  pregnancy. Try to exercise for 30 minutes at least 5 days a week. Exercising will help you: ? Control your weight. ? Stay in shape. ? Be prepared for labor and delivery.  Experiencing pain or cramping in the lower abdomen or lower back is a good sign that you should stop exercising. Check with your health care provider before continuing with normal exercises.  Try to avoid standing for long periods of time. Move your legs often if you must stand in one place for a long time.  Avoid heavy lifting.  Wear low-heeled shoes and practice good posture.  You may continue to have sex unless your health care provider tells you not to. Relieving pain and discomfort  Wear a good support bra to relieve breast tenderness.  Take warm sitz baths to soothe any pain or discomfort caused by hemorrhoids. Use hemorrhoid cream if your health care provider approves.  Rest with your legs elevated if you have leg cramps or low back pain.  If you develop   varicose veins in your legs, wear support hose. Elevate your feet for 15 minutes, 3-4 times a day. Limit salt in your diet. Prenatal care  Schedule your prenatal visits by the twelfth week of pregnancy. They are usually scheduled monthly at first, then more often in the last 2 months before delivery.  Write down your questions. Take them to your prenatal visits.  Keep all your prenatal visits as told by your health care provider. This is important. Safety  Wear your seat belt at all times when driving.  Make a list of emergency phone numbers, including numbers for family, friends, the hospital, and police and fire departments. General instructions  Ask your health care provider for a referral to a local prenatal education class. Begin classes no later than the beginning of month 6 of your pregnancy.  Ask for help if you have counseling or nutritional needs during pregnancy. Your health care provider can offer advice or refer you to specialists for help  with various needs.  Do not use hot tubs, steam rooms, or saunas.  Do not douche or use tampons or scented sanitary pads.  Do not cross your legs for long periods of time.  Avoid cat litter boxes and soil used by cats. These carry germs that can cause birth defects in the baby and possibly loss of the fetus by miscarriage or stillbirth.  Avoid all smoking, herbs, alcohol, and medicines not prescribed by your health care provider. Chemicals in these products affect the formation and growth of the baby.  Do not use any products that contain nicotine or tobacco, such as cigarettes and e-cigarettes. If you need help quitting, ask your health care provider. You may receive counseling support and other resources to help you quit.  Schedule a dentist appointment. At home, brush your teeth with a soft toothbrush and be gentle when you floss. Contact a health care provider if:  You have dizziness.  You have mild pelvic cramps, pelvic pressure, or nagging pain in the abdominal area.  You have persistent nausea, vomiting, or diarrhea.  You have a bad smelling vaginal discharge.  You have pain when you urinate.  You notice increased swelling in your face, hands, legs, or ankles.  You are exposed to fifth disease or chickenpox.  You are exposed to German measles (rubella) and have never had it. Get help right away if:  You have a fever.  You are leaking fluid from your vagina.  You have spotting or bleeding from your vagina.  You have severe abdominal cramping or pain.  You have rapid weight gain or loss.  You vomit blood or material that looks like coffee grounds.  You develop a severe headache.  You have shortness of breath.  You have any kind of trauma, such as from a fall or a car accident. Summary  The first trimester of pregnancy is from week 1 until the end of week 13 (months 1 through 3).  Your body goes through many changes during pregnancy. The changes vary from  woman to woman.  You will have routine prenatal visits. During those visits, your health care provider will examine you, discuss any test results you may have, and talk with you about how you are feeling. This information is not intended to replace advice given to you by your health care provider. Make sure you discuss any questions you have with your health care provider. Document Released: 09/09/2001 Document Revised: 08/27/2016 Document Reviewed: 08/27/2016 Elsevier Interactive Patient Education  2017 Elsevier   Inc.  

## 2017-01-08 ENCOUNTER — Other Ambulatory Visit: Payer: Self-pay | Admitting: *Deleted

## 2017-01-08 DIAGNOSIS — Z3201 Encounter for pregnancy test, result positive: Secondary | ICD-10-CM

## 2017-01-08 DIAGNOSIS — N912 Amenorrhea, unspecified: Secondary | ICD-10-CM

## 2017-01-08 LAB — HCG, QUANTITATIVE, PREGNANCY: hCG, Beta Chain, Quant, S: 107.1 m[IU]/mL — ABNORMAL HIGH

## 2017-01-08 NOTE — Progress Notes (Signed)
Encounter reviewed by Dr. Nayan Proch Amundson C. Silva.  

## 2017-01-09 ENCOUNTER — Other Ambulatory Visit: Payer: Self-pay | Admitting: *Deleted

## 2017-01-09 DIAGNOSIS — N912 Amenorrhea, unspecified: Secondary | ICD-10-CM

## 2017-01-13 ENCOUNTER — Other Ambulatory Visit (INDEPENDENT_AMBULATORY_CARE_PROVIDER_SITE_OTHER): Payer: BC Managed Care – PPO

## 2017-01-13 ENCOUNTER — Telehealth: Payer: Self-pay | Admitting: Obstetrics and Gynecology

## 2017-01-13 DIAGNOSIS — N912 Amenorrhea, unspecified: Secondary | ICD-10-CM

## 2017-01-13 LAB — HCG, QUANTITATIVE, PREGNANCY: hCG, Beta Chain, Quant, S: 1687.8 m[IU]/mL — ABNORMAL HIGH

## 2017-01-13 NOTE — Telephone Encounter (Signed)
Thank you for the update.  I have closed the encounter.   Cc- Shirlyn Goltz

## 2017-01-13 NOTE — Telephone Encounter (Signed)
Patient cancelled ultrasound on 01/29/17 because she is going to an OB office to have done.

## 2017-01-22 ENCOUNTER — Other Ambulatory Visit: Payer: BC Managed Care – PPO | Admitting: Obstetrics and Gynecology

## 2017-01-22 ENCOUNTER — Other Ambulatory Visit: Payer: BC Managed Care – PPO

## 2017-01-29 ENCOUNTER — Other Ambulatory Visit: Payer: Self-pay | Admitting: Obstetrics and Gynecology

## 2017-01-29 ENCOUNTER — Other Ambulatory Visit: Payer: Self-pay

## 2017-02-06 LAB — OB RESULTS CONSOLE GC/CHLAMYDIA
Chlamydia: NEGATIVE
Gonorrhea: NEGATIVE

## 2017-02-06 LAB — OB RESULTS CONSOLE ABO/RH: RH Type: POSITIVE

## 2017-02-06 LAB — OB RESULTS CONSOLE HIV ANTIBODY (ROUTINE TESTING): HIV: NONREACTIVE

## 2017-02-06 LAB — OB RESULTS CONSOLE RPR: RPR: NONREACTIVE

## 2017-02-06 LAB — OB RESULTS CONSOLE ANTIBODY SCREEN: Antibody Screen: NEGATIVE

## 2017-02-06 LAB — OB RESULTS CONSOLE HEPATITIS B SURFACE ANTIGEN: Hepatitis B Surface Ag: NEGATIVE

## 2017-02-06 LAB — OB RESULTS CONSOLE RUBELLA ANTIBODY, IGM: Rubella: IMMUNE

## 2017-04-13 ENCOUNTER — Telehealth: Payer: Self-pay | Admitting: Obstetrics and Gynecology

## 2017-04-13 NOTE — Telephone Encounter (Signed)
Left message to call to reschedule her Ria CommentPatricia Diaz appointment with another provider.

## 2017-05-25 ENCOUNTER — Ambulatory Visit: Payer: BC Managed Care – PPO | Admitting: Nurse Practitioner

## 2017-06-02 ENCOUNTER — Other Ambulatory Visit (HOSPITAL_COMMUNITY): Payer: Self-pay | Admitting: Obstetrics and Gynecology

## 2017-06-02 DIAGNOSIS — Z3689 Encounter for other specified antenatal screening: Secondary | ICD-10-CM

## 2017-06-09 ENCOUNTER — Encounter (HOSPITAL_COMMUNITY): Payer: Self-pay

## 2017-06-09 ENCOUNTER — Ambulatory Visit (HOSPITAL_COMMUNITY): Payer: BC Managed Care – PPO

## 2017-06-11 ENCOUNTER — Observation Stay (HOSPITAL_COMMUNITY)
Admission: RE | Admit: 2017-06-11 | Discharge: 2017-06-11 | Disposition: A | Discharge status: Home / Self Care | Payer: BC Managed Care – PPO | Source: Ambulatory Visit | Attending: Obstetrics and Gynecology | Admitting: Obstetrics and Gynecology

## 2017-06-11 ENCOUNTER — Other Ambulatory Visit (HOSPITAL_COMMUNITY): Payer: Self-pay | Admitting: Obstetrics and Gynecology

## 2017-06-11 ENCOUNTER — Encounter (HOSPITAL_COMMUNITY): Payer: Self-pay

## 2017-06-11 DIAGNOSIS — Z3A26 26 weeks gestation of pregnancy: Secondary | ICD-10-CM | POA: Diagnosis not present

## 2017-06-11 DIAGNOSIS — O99212 Obesity complicating pregnancy, second trimester: Secondary | ICD-10-CM

## 2017-06-11 DIAGNOSIS — Z3689 Encounter for other specified antenatal screening: Secondary | ICD-10-CM

## 2017-06-11 DIAGNOSIS — O321XX0 Maternal care for breech presentation, not applicable or unspecified: Secondary | ICD-10-CM | POA: Insufficient documentation

## 2017-07-01 ENCOUNTER — Encounter: Payer: BC Managed Care – PPO | Attending: Family Medicine | Admitting: Registered"

## 2017-07-01 DIAGNOSIS — Z3A00 Weeks of gestation of pregnancy not specified: Secondary | ICD-10-CM | POA: Insufficient documentation

## 2017-07-01 DIAGNOSIS — Z713 Dietary counseling and surveillance: Secondary | ICD-10-CM | POA: Diagnosis not present

## 2017-07-01 DIAGNOSIS — R7309 Other abnormal glucose: Secondary | ICD-10-CM

## 2017-07-01 DIAGNOSIS — O24419 Gestational diabetes mellitus in pregnancy, unspecified control: Secondary | ICD-10-CM | POA: Insufficient documentation

## 2017-07-02 ENCOUNTER — Encounter (HOSPITAL_COMMUNITY): Payer: Self-pay

## 2017-07-02 ENCOUNTER — Other Ambulatory Visit: Payer: Self-pay

## 2017-07-02 NOTE — Progress Notes (Signed)
Patient was seen on 07/01/2017 for Gestational Diabetes self-management class at the [facility]. The following learning objectives were met by the patient during this course:   States the definition of Gestational Diabetes  States why dietary management is important in controlling blood glucose  Describes the effects each nutrient has on blood glucose levels  Demonstrates ability to create a balanced meal plan  Demonstrates carbohydrate counting   States when to check blood glucose levels  Demonstrates proper blood glucose monitoring techniques  States the effect of stress and exercise on blood glucose levels  States the importance of limiting caffeine and abstaining from alcohol and smoking  Blood glucose monitor given: Contour Next Lot # [id number] Exp: 06/28/2017 (date on box expired, but gave to pt because test strip box expiration is 09/28/2017) Blood glucose reading: 92  Patient instructed to monitor glucose levels: FBS: 60 - <95 1 hour: <140 2 hour: <120  Patient received handouts:  Nutrition Diabetes and Pregnancy  Carbohydrate Counting List  Patient will be seen for follow-up as needed.

## 2017-07-06 ENCOUNTER — Encounter: Payer: Self-pay | Admitting: Registered"

## 2017-07-06 DIAGNOSIS — R7309 Other abnormal glucose: Secondary | ICD-10-CM | POA: Insufficient documentation

## 2017-08-17 ENCOUNTER — Telehealth (HOSPITAL_COMMUNITY): Payer: Self-pay | Admitting: *Deleted

## 2017-08-17 ENCOUNTER — Encounter (HOSPITAL_COMMUNITY): Payer: Self-pay | Admitting: *Deleted

## 2017-08-17 NOTE — Telephone Encounter (Signed)
Preadmission screen  

## 2017-08-25 ENCOUNTER — Encounter (HOSPITAL_COMMUNITY): Payer: Self-pay

## 2017-08-26 NOTE — H&P (Signed)
Tina KollerKimberly C Diaz is a 36 y.o. female presenting for Primary cesarean section. U/S 08/24/17 showed vtx, EFW 3970 gm (8# 12oz). Pregnancy complicated by GDM with difficult control on glyburide. OB History    Gravida Para Term Preterm AB Living   2 0 0 0 1 0   SAB TAB Ectopic Multiple Live Births   1 0 0 0 0     Past Medical History:  Diagnosis Date  . Anxiety   . Depression    situational  . History of PCOS    with hirtsutism   Past Surgical History:  Procedure Laterality Date  . NO PAST SURGERIES     Family History: family history includes Cancer in her maternal grandmother; Diabetes in her father, paternal grandfather, and paternal grandmother; Heart disease in her father and maternal grandfather; Hypertension in her father and maternal grandfather; Ovarian cancer (age of onset: 4240) in her maternal grandmother. Social History:  reports that  has never smoked. she has never used smokeless tobacco. She reports that she drinks alcohol. She reports that she does not use drugs.     Maternal Diabetes: No Genetic Screening: Normal Maternal Ultrasounds/Referrals: Normal Fetal Ultrasounds or other Referrals:  None Maternal Substance Abuse:  No Significant Maternal Medications:  Meds include: Other: glyburide Significant Maternal Lab Results:  None Other Comments:  None  ROS History   Last menstrual period 12/07/2016. Maternal Exam:  Abdomen: Fetal presentation: vertex     Physical Exam  Cardiovascular: Normal rate and regular rhythm.  Respiratory: Effort normal and breath sounds normal.  GI: Soft. There is no tenderness.  Neurological: She has normal reflexes.    Prenatal labs: ABO, Rh: A/Positive/-- (05/11 0000) Antibody: Negative (05/11 0000) Rubella: Immune (05/11 0000) RPR: Nonreactive (05/11 0000)  HBsAg: Negative (05/11 0000)  HIV: Non-reactive (05/11 0000)  GBS:     Assessment/Plan: 36 yo G2P0 with fetal macrosomia and GDM D/W patient risks of vaginal  delivery with macrosomic infant in setting of GDM D/W cesarean section and risks including infection, organ damage, bleeding/transfusion-HIV/Hep, DVT/PE, pneumonia. Patient states she understands and agrees   Tina LocusJames E Izear Pine Diaz 08/26/2017, 2:02 PM

## 2017-08-31 ENCOUNTER — Inpatient Hospital Stay (HOSPITAL_COMMUNITY): Payer: BC Managed Care – PPO

## 2017-08-31 ENCOUNTER — Encounter (HOSPITAL_COMMUNITY)
Admission: RE | Admit: 2017-08-31 | Discharge: 2017-08-31 | Disposition: A | Discharge status: Home / Self Care | Payer: BC Managed Care – PPO | Source: Ambulatory Visit | Attending: Obstetrics and Gynecology | Admitting: Obstetrics and Gynecology

## 2017-08-31 LAB — COMPREHENSIVE METABOLIC PANEL
ALT: 9 U/L — ABNORMAL LOW (ref 14–54)
AST: 16 U/L (ref 15–41)
Albumin: 2.6 g/dL — ABNORMAL LOW (ref 3.5–5.0)
Alkaline Phosphatase: 125 U/L (ref 38–126)
Anion gap: 9 (ref 5–15)
BUN: 9 mg/dL (ref 6–20)
CO2: 21 mmol/L — ABNORMAL LOW (ref 22–32)
Calcium: 8.9 mg/dL (ref 8.9–10.3)
Chloride: 106 mmol/L (ref 101–111)
Creatinine, Ser: 0.57 mg/dL (ref 0.44–1.00)
GFR calc Af Amer: >60 mL/min (ref >60)
GFR calc non Af Amer: >60 mL/min (ref >60)
Glucose, Bld: 119 mg/dL — ABNORMAL HIGH (ref 65–99)
Potassium: 3.9 mmol/L (ref 3.5–5.1)
Sodium: 136 mmol/L (ref 135–145)
Total Bilirubin: 0.3 mg/dL (ref 0.3–1.2)
Total Protein: 6.2 g/dL — ABNORMAL LOW (ref 6.5–8.1)

## 2017-08-31 LAB — CBC
HCT: 36 % (ref 36.0–46.0)
Hemoglobin: 11.9 g/dL — ABNORMAL LOW (ref 12.0–15.0)
MCH: 29.3 pg (ref 26.0–34.0)
MCHC: 33.1 g/dL (ref 30.0–36.0)
MCV: 88.7 fL (ref 78.0–100.0)
Platelets: 219 10*3/uL (ref 150–400)
RBC: 4.06 MIL/uL (ref 3.87–5.11)
RDW: 14.4 % (ref 11.5–15.5)
WBC: 11.2 10*3/uL — ABNORMAL HIGH (ref 4.0–10.5)

## 2017-08-31 LAB — TYPE AND SCREEN
ABO/RH(D): A POS
Antibody Screen: NEGATIVE

## 2017-08-31 LAB — ABO/RH: ABO/RH(D): A POS

## 2017-08-31 NOTE — Patient Instructions (Signed)
[name]  08/31/2017   Your procedure is scheduled on:  09/01/2017  Enter through the Main Entrance of [facility] at 1100 AM.  Pick up the phone at the desk and dial [phone]  Call this number if you have problems the morning of surgery:[phone]  Remember:   Do not eat food:After Midnight.  Do not drink clear liquids: After Midnight.  Take these medicines the morning of surgery with A SIP OF WATER: take your zoloft as usual.  DO NOT TAKE YOUR GLYBURIDE THE MORNING OF SURGERY.  You may use your flonase and other allergy medicines as usual.  Do not wear jewelry, make-up or nail polish.  Do not wear lotions, powders, or perfumes. Do not wear deodorant.  Do not shave 48 hours prior to surgery.  Do not bring valuables to the hospital.  [facility] is not   responsible for any belongings or valuables brought to the hospital.  Contacts, dentures or bridgework may not be worn into surgery.  Leave suitcase in the car. After surgery it may be brought to your room.  For patients admitted to the hospital, checkout time is 11:00 AM the day of              discharge.    N/A   Please read over the following fact sheets that you were given:   Surgical Site Infection Prevention

## 2017-09-01 ENCOUNTER — Inpatient Hospital Stay (HOSPITAL_COMMUNITY)
Admission: RE | Admit: 2017-09-01 | Discharge: 2017-09-04 | DRG: 787 | Disposition: A | Discharge status: Home / Self Care | Payer: BC Managed Care – PPO | Source: Ambulatory Visit | Attending: Obstetrics and Gynecology | Admitting: Obstetrics and Gynecology

## 2017-09-01 ENCOUNTER — Inpatient Hospital Stay (HOSPITAL_COMMUNITY): Payer: BC Managed Care – PPO | Admitting: Anesthesiology

## 2017-09-01 ENCOUNTER — Encounter (HOSPITAL_COMMUNITY): Payer: Self-pay

## 2017-09-01 ENCOUNTER — Encounter (HOSPITAL_COMMUNITY)
Admission: RE | Disposition: A | Discharge status: Home / Self Care | Payer: Self-pay | Source: Ambulatory Visit | Attending: Obstetrics and Gynecology

## 2017-09-01 DIAGNOSIS — O3663X0 Maternal care for excessive fetal growth, third trimester, not applicable or unspecified: Secondary | ICD-10-CM | POA: Diagnosis present

## 2017-09-01 DIAGNOSIS — O24425 Gestational diabetes mellitus in childbirth, controlled by oral hypoglycemic drugs: Secondary | ICD-10-CM | POA: Diagnosis present

## 2017-09-01 DIAGNOSIS — D62 Acute posthemorrhagic anemia: Secondary | ICD-10-CM | POA: Diagnosis not present

## 2017-09-01 DIAGNOSIS — O99214 Obesity complicating childbirth: Secondary | ICD-10-CM | POA: Diagnosis present

## 2017-09-01 DIAGNOSIS — Z3A38 38 weeks gestation of pregnancy: Secondary | ICD-10-CM

## 2017-09-01 DIAGNOSIS — O9081 Anemia of the puerperium: Secondary | ICD-10-CM | POA: Diagnosis not present

## 2017-09-01 DIAGNOSIS — O3660X0 Maternal care for excessive fetal growth, unspecified trimester, not applicable or unspecified: Secondary | ICD-10-CM

## 2017-09-01 DIAGNOSIS — O3663X1 Maternal care for excessive fetal growth, third trimester, fetus 1: Secondary | ICD-10-CM

## 2017-09-01 LAB — RPR: RPR Ser Ql: NONREACTIVE

## 2017-09-01 LAB — GLUCOSE, CAPILLARY
Glucose-Capillary: 98 mg/dL (ref 65–99)
Glucose-Capillary: 81 mg/dL (ref 65–99)

## 2017-09-01 SURGERY — Surgical Case
Anesthesia: Spinal

## 2017-09-01 MED ORDER — FENTANYL CITRATE (PF) 100 MCG/2ML IJ SOLN
INTRAMUSCULAR | Status: AC
  Filled 2017-09-01: qty 2

## 2017-09-01 MED ORDER — SODIUM CHLORIDE 0.9 % IR SOLN
Status: DC | PRN
  Administered 2017-09-01: 500 mL

## 2017-09-01 MED ORDER — WITCH HAZEL-GLYCERIN EX PADS: 1.0000 "application " | MEDICATED_PAD | CUTANEOUS | Status: DC | PRN

## 2017-09-01 MED ORDER — OXYTOCIN 10 UNIT/ML IJ SOLN
INTRAVENOUS | Status: DC | PRN
  Administered 2017-09-01: 40 [IU] via INTRAVENOUS

## 2017-09-01 MED ORDER — MEPERIDINE HCL 25 MG/ML IJ SOLN: 6.2500 mg | INTRAMUSCULAR | Status: DC | PRN

## 2017-09-01 MED ORDER — ONDANSETRON HCL 4 MG/2ML IJ SOLN: 4.0000 mg | Freq: Three times a day (TID) | INTRAMUSCULAR | Status: DC | PRN

## 2017-09-01 MED ORDER — SIMETHICONE 80 MG PO CHEW
80.0000 mg | CHEWABLE_TABLET | Freq: Three times a day (TID) | ORAL | Status: DC
  Administered 2017-09-02 – 2017-09-04 (×7): 80 mg via ORAL
  Filled 2017-09-01 (×7): qty 1

## 2017-09-01 MED ORDER — PRENATAL MULTIVITAMIN CH
1.0000 | ORAL_TABLET | Freq: Every day | ORAL | Status: DC
  Administered 2017-09-02 – 2017-09-04 (×3): 1 via ORAL
  Filled 2017-09-01 (×3): qty 1

## 2017-09-01 MED ORDER — MORPHINE SULFATE (PF) 0.5 MG/ML IJ SOLN
INTRAMUSCULAR | Status: DC | PRN
  Administered 2017-09-01: .2 mg via INTRATHECAL

## 2017-09-01 MED ORDER — OXYCODONE HCL 5 MG PO TABS
5.0000 mg | ORAL_TABLET | ORAL | Status: DC | PRN
  Administered 2017-09-03: 5 mg via ORAL
  Filled 2017-09-01: qty 1

## 2017-09-01 MED ORDER — LACTATED RINGERS IV SOLN
INTRAVENOUS | Status: DC
  Administered 2017-09-01 – 2017-09-02 (×2): via INTRAVENOUS

## 2017-09-01 MED ORDER — NALOXONE HCL 0.4 MG/ML IJ SOLN: 1.0000 ug/kg/h | INTRAMUSCULAR | Status: DC | PRN

## 2017-09-01 MED ORDER — FENTANYL CITRATE (PF) 100 MCG/2ML IJ SOLN
INTRAMUSCULAR | Status: DC | PRN
  Administered 2017-09-01: 10 ug via INTRATHECAL

## 2017-09-01 MED ORDER — SODIUM CHLORIDE 0.9% FLUSH: 3.0000 mL | INTRAVENOUS | Status: DC | PRN

## 2017-09-01 MED ORDER — DIPHENHYDRAMINE HCL 25 MG PO CAPS: 25.0000 mg | ORAL_CAPSULE | Freq: Four times a day (QID) | ORAL | Status: DC | PRN

## 2017-09-01 MED ORDER — NALBUPHINE HCL 10 MG/ML IJ SOLN: 5.0000 mg | Freq: Once | INTRAMUSCULAR | Status: DC | PRN

## 2017-09-01 MED ORDER — KETOROLAC TROMETHAMINE 30 MG/ML IJ SOLN: 30.0000 mg | Freq: Four times a day (QID) | INTRAMUSCULAR | Status: AC | PRN

## 2017-09-01 MED ORDER — ONDANSETRON HCL 4 MG/2ML IJ SOLN
INTRAMUSCULAR | Status: AC
Start: 2017-09-01 — End: 2017-09-01
  Filled 2017-09-01: qty 2

## 2017-09-01 MED ORDER — SERTRALINE HCL 100 MG PO TABS
100.0000 mg | ORAL_TABLET | Freq: Every evening | ORAL | Status: DC
  Administered 2017-09-01 – 2017-09-03 (×3): 100 mg via ORAL
  Filled 2017-09-01 (×4): qty 1

## 2017-09-01 MED ORDER — SIMETHICONE 80 MG PO CHEW: 80.0000 mg | CHEWABLE_TABLET | ORAL | Status: DC | PRN

## 2017-09-01 MED ORDER — NALBUPHINE HCL 10 MG/ML IJ SOLN
INTRAMUSCULAR | Status: AC
  Administered 2017-09-01: 5 mg via SUBCUTANEOUS
  Filled 2017-09-01: qty 1

## 2017-09-01 MED ORDER — OXYTOCIN 40 UNITS IN LACTATED RINGERS INFUSION - SIMPLE MED: 2.5000 [IU]/h | INTRAVENOUS | Status: AC

## 2017-09-01 MED ORDER — TETANUS-DIPHTH-ACELL PERTUSSIS 5-2.5-18.5 LF-MCG/0.5 IM SUSP: 0.5000 mL | Freq: Once | INTRAMUSCULAR | Status: DC

## 2017-09-01 MED ORDER — DIPHENHYDRAMINE HCL 50 MG/ML IJ SOLN
INTRAMUSCULAR | Status: AC
  Administered 2017-09-01: 12.5 mg via INTRAVENOUS
  Filled 2017-09-01: qty 1

## 2017-09-01 MED ORDER — COCONUT OIL OIL: 1.0000 "application " | TOPICAL_OIL | Status: DC | PRN

## 2017-09-01 MED ORDER — KETOROLAC TROMETHAMINE 30 MG/ML IJ SOLN
INTRAMUSCULAR | Status: AC
  Administered 2017-09-01: 30 mg via INTRAMUSCULAR
  Filled 2017-09-01: qty 1

## 2017-09-01 MED ORDER — SIMETHICONE 80 MG PO CHEW
80.0000 mg | CHEWABLE_TABLET | ORAL | Status: DC
  Administered 2017-09-01 – 2017-09-03 (×3): 80 mg via ORAL
  Filled 2017-09-01 (×3): qty 1

## 2017-09-01 MED ORDER — ZOLPIDEM TARTRATE 5 MG PO TABS: 5.0000 mg | ORAL_TABLET | Freq: Every evening | ORAL | Status: DC | PRN

## 2017-09-01 MED ORDER — GENTAMICIN SULFATE 40 MG/ML IJ SOLN
INTRAVENOUS | Status: AC
  Administered 2017-09-01: 238 mL via INTRAVENOUS
  Filled 2017-09-01: qty 13

## 2017-09-01 MED ORDER — LACTATED RINGERS IV SOLN
INTRAVENOUS | Status: DC | PRN
  Administered 2017-09-01 (×3): via INTRAVENOUS

## 2017-09-01 MED ORDER — DIBUCAINE 1 % RE OINT: 1.0000 "application " | TOPICAL_OINTMENT | RECTAL | Status: DC | PRN

## 2017-09-01 MED ORDER — PHENYLEPHRINE 8 MG IN D5W 100 ML (0.08MG/ML) PREMIX OPTIME
INJECTION | INTRAVENOUS | Status: AC
Start: 2017-09-01 — End: 2017-09-01
  Filled 2017-09-01: qty 100

## 2017-09-01 MED ORDER — SODIUM CHLORIDE 0.9 % IV SOLN: INTRAVENOUS | Status: DC

## 2017-09-01 MED ORDER — PHENYLEPHRINE 40 MCG/ML (10ML) SYRINGE FOR IV PUSH (FOR BLOOD PRESSURE SUPPORT)
PREFILLED_SYRINGE | INTRAVENOUS | Status: AC
  Filled 2017-09-01: qty 10

## 2017-09-01 MED ORDER — DIPHENHYDRAMINE HCL 25 MG PO CAPS
25.0000 mg | ORAL_CAPSULE | ORAL | Status: DC | PRN
  Filled 2017-09-01: qty 1

## 2017-09-01 MED ORDER — DIPHENHYDRAMINE HCL 50 MG/ML IJ SOLN
12.5000 mg | INTRAMUSCULAR | Status: DC | PRN
  Administered 2017-09-01 (×2): 12.5 mg via INTRAVENOUS
  Filled 2017-09-01: qty 1

## 2017-09-01 MED ORDER — MENTHOL 3 MG MT LOZG: 1.0000 | LOZENGE | OROMUCOSAL | Status: DC | PRN

## 2017-09-01 MED ORDER — NALOXONE HCL 0.4 MG/ML IJ SOLN: 0.4000 mg | INTRAMUSCULAR | Status: DC | PRN

## 2017-09-01 MED ORDER — OXYTOCIN 10 UNIT/ML IJ SOLN
INTRAMUSCULAR | Status: AC
  Filled 2017-09-01: qty 4

## 2017-09-01 MED ORDER — SOD CITRATE-CITRIC ACID 500-334 MG/5ML PO SOLN
30.0000 mL | ORAL | Status: AC
  Administered 2017-09-01: 30 mL via ORAL
  Filled 2017-09-01: qty 15

## 2017-09-01 MED ORDER — SCOPOLAMINE 1 MG/3DAYS TD PT72
1.0000 | MEDICATED_PATCH | Freq: Once | TRANSDERMAL | Status: AC
  Administered 2017-09-01: 1.5 mg via TRANSDERMAL
  Filled 2017-09-01: qty 1

## 2017-09-01 MED ORDER — PHENYLEPHRINE 8 MG IN D5W 100 ML (0.08MG/ML) PREMIX OPTIME
INJECTION | INTRAVENOUS | Status: DC | PRN
  Administered 2017-09-01: 60 ug/min via INTRAVENOUS

## 2017-09-01 MED ORDER — NALBUPHINE HCL 10 MG/ML IJ SOLN: 5.0000 mg | INTRAMUSCULAR | Status: DC | PRN

## 2017-09-01 MED ORDER — SENNOSIDES-DOCUSATE SODIUM 8.6-50 MG PO TABS
2.0000 | ORAL_TABLET | ORAL | Status: DC
  Administered 2017-09-01 – 2017-09-03 (×3): 2 via ORAL
  Filled 2017-09-01 (×3): qty 2

## 2017-09-01 MED ORDER — IBUPROFEN 600 MG PO TABS
600.0000 mg | ORAL_TABLET | Freq: Four times a day (QID) | ORAL | Status: DC
  Administered 2017-09-01 – 2017-09-04 (×11): 600 mg via ORAL
  Filled 2017-09-01 (×11): qty 1

## 2017-09-01 MED ORDER — KETOROLAC TROMETHAMINE 30 MG/ML IJ SOLN
30.0000 mg | Freq: Four times a day (QID) | INTRAMUSCULAR | Status: AC | PRN
  Administered 2017-09-01: 30 mg via INTRAMUSCULAR

## 2017-09-01 MED ORDER — ACETAMINOPHEN 325 MG PO TABS
650.0000 mg | ORAL_TABLET | ORAL | Status: DC | PRN
  Administered 2017-09-02 – 2017-09-04 (×4): 650 mg via ORAL
  Filled 2017-09-01 (×4): qty 2

## 2017-09-01 MED ORDER — ACETAMINOPHEN 500 MG PO TABS
1000.0000 mg | ORAL_TABLET | Freq: Four times a day (QID) | ORAL | Status: AC
  Administered 2017-09-01 – 2017-09-02 (×3): 1000 mg via ORAL
  Filled 2017-09-01 (×3): qty 2

## 2017-09-01 MED ORDER — MORPHINE SULFATE (PF) 0.5 MG/ML IJ SOLN
INTRAMUSCULAR | Status: AC
  Filled 2017-09-01: qty 10

## 2017-09-01 MED ORDER — PHENYLEPHRINE 40 MCG/ML (10ML) SYRINGE FOR IV PUSH (FOR BLOOD PRESSURE SUPPORT)
PREFILLED_SYRINGE | INTRAVENOUS | Status: DC | PRN
  Administered 2017-09-01 (×8): 80 ug via INTRAVENOUS

## 2017-09-01 MED ORDER — ONDANSETRON HCL 4 MG/2ML IJ SOLN
INTRAMUSCULAR | Status: DC | PRN
  Administered 2017-09-01: 4 mg via INTRAVENOUS

## 2017-09-01 MED ORDER — NALBUPHINE HCL 10 MG/ML IJ SOLN
5.0000 mg | INTRAMUSCULAR | Status: DC | PRN
  Administered 2017-09-01: 5 mg via SUBCUTANEOUS

## 2017-09-01 MED ORDER — BUPIVACAINE IN DEXTROSE 0.75-8.25 % IT SOLN
INTRATHECAL | Status: DC | PRN
  Administered 2017-09-01: 1.05 mg via INTRATHECAL

## 2017-09-01 MED ORDER — OXYCODONE HCL 5 MG PO TABS: 10.0000 mg | ORAL_TABLET | ORAL | Status: DC | PRN

## 2017-09-01 SURGICAL SUPPLY — 39 items
ADH SKN CLS APL DERMABOND .7 (GAUZE/BANDAGES/DRESSINGS)
APL SKNCLS STERI-STRIP NONHPOA (GAUZE/BANDAGES/DRESSINGS) ×1
BENZOIN TINCTURE PRP APPL 2/3 (GAUZE/BANDAGES/DRESSINGS) ×1 IMPLANT
CHLORAPREP W/TINT 26ML (MISCELLANEOUS) ×2 IMPLANT
CLAMP CORD UMBIL (MISCELLANEOUS) IMPLANT
CLOTH BEACON ORANGE TIMEOUT ST (SAFETY) ×2 IMPLANT
CONTAINER PREFILL 10% NBF 15ML (MISCELLANEOUS) IMPLANT
COVER LIGHT HANDLE  1/PK (MISCELLANEOUS) ×1
COVER LIGHT HANDLE 1/PK (MISCELLANEOUS) IMPLANT
DERMABOND ADVANCED (GAUZE/BANDAGES/DRESSINGS)
DERMABOND ADVANCED .7 DNX12 (GAUZE/BANDAGES/DRESSINGS) IMPLANT
DRSG OPSITE POSTOP 4X10 (GAUZE/BANDAGES/DRESSINGS) ×2 IMPLANT
ELECT REM PT RETURN 9FT ADLT (ELECTROSURGICAL) ×2
ELECTRODE REM PT RTRN 9FT ADLT (ELECTROSURGICAL) ×1 IMPLANT
EXTRACTOR VACUUM M CUP 4 TUBE (SUCTIONS) ×1 IMPLANT
GLOVE BIO SURGEON STRL SZ7.5 (GLOVE) ×2 IMPLANT
GLOVE BIOGEL PI IND STRL 7.0 (GLOVE) ×1 IMPLANT
GLOVE BIOGEL PI INDICATOR 7.0 (GLOVE) ×1
GOWN STRL REUS W/TWL LRG LVL3 (GOWN DISPOSABLE) ×4 IMPLANT
HOVERMATT SINGLE USE (MISCELLANEOUS) ×1 IMPLANT
KIT ABG SYR 3ML LUER SLIP (SYRINGE) ×2 IMPLANT
NDL HYPO 25X5/8 SAFETYGLIDE (NEEDLE) ×1 IMPLANT
NEEDLE HYPO 25X5/8 SAFETYGLIDE (NEEDLE) ×2 IMPLANT
NS IRRIG 1000ML POUR BTL (IV SOLUTION) ×2 IMPLANT
PACK C SECTION WH (CUSTOM PROCEDURE TRAY) ×2 IMPLANT
PAD ABD 7.5X8 STRL (GAUZE/BANDAGES/DRESSINGS) ×2 IMPLANT
PAD OB MATERNITY 4.3X12.25 (PERSONAL CARE ITEMS) ×2 IMPLANT
PENCIL SMOKE EVAC W/HOLSTER (ELECTROSURGICAL) ×2 IMPLANT
STRIP CLOSURE SKIN 1/2X4 (GAUZE/BANDAGES/DRESSINGS) ×1 IMPLANT
SUT MNCRL 0 VIOLET CTX 36 (SUTURE) ×4 IMPLANT
SUT MONOCRYL 0 CTX 36 (SUTURE) ×5
SUT PDS AB 0 CTX 60 (SUTURE) ×2 IMPLANT
SUT PLAIN 0 NONE (SUTURE) IMPLANT
SUT PLAIN 2 0 (SUTURE)
SUT PLAIN 2 0 XLH (SUTURE) IMPLANT
SUT PLAIN ABS 2-0 CT1 27XMFL (SUTURE) IMPLANT
SUT VIC AB 4-0 KS 27 (SUTURE) ×2 IMPLANT
TOWEL OR 17X24 6PK STRL BLUE (TOWEL DISPOSABLE) ×2 IMPLANT
TRAY FOLEY BAG SILVER LF 14FR (SET/KITS/TRAYS/PACK) ×2 IMPLANT

## 2017-09-01 NOTE — Anesthesia Procedure Notes (Signed)
Spinal  Patient location during procedure: OR Staffing Anesthesiologist: Enis Riecke, MD Performed: anesthesiologist  Preanesthetic Checklist Completed: patient identified, site marked, surgical consent, pre-op evaluation, timeout performed, IV checked, risks and benefits discussed and monitors and equipment checked Spinal Block Patient position: sitting Prep: DuraPrep Patient monitoring: heart rate, continuous pulse ox and blood pressure Approach: midline Location: L3-4 Injection technique: single-shot Needle Needle type: Sprotte  Needle gauge: 24 G Needle length: 9 cm Additional Notes Expiration date of kit checked and confirmed. Patient tolerated procedure well, without complications.       

## 2017-09-01 NOTE — Anesthesia Preprocedure Evaluation (Signed)
Anesthesia Evaluation  Patient identified by MRN, date of birth, ID band Patient awake    Reviewed: Allergy & Precautions, NPO status , Patient's Chart, lab work & pertinent test results  Airway Mallampati: II  TM Distance: >3 FB Neck ROM: Full    Dental no notable dental hx.    Pulmonary neg pulmonary ROS,    Pulmonary exam normal breath sounds clear to auscultation       Cardiovascular negative cardio ROS Normal cardiovascular exam Rhythm:Regular Rate:Normal     Neuro/Psych negative neurological ROS  negative psych ROS   GI/Hepatic negative GI ROS, Neg liver ROS,   Endo/Other  diabetes, GestationalMorbid obesity  Renal/GU negative Renal ROS  negative genitourinary   Musculoskeletal negative musculoskeletal ROS (+)   Abdominal   Peds negative pediatric ROS (+)  Hematology negative hematology ROS (+)   Anesthesia Other Findings   Reproductive/Obstetrics (+) Pregnancy                             Anesthesia Physical Anesthesia Plan  ASA: III  Anesthesia Plan: Spinal   Post-op Pain Management:    Induction:   PONV Risk Score and Plan: 2 and Treatment may vary due to age or medical condition, Ondansetron and Scopolamine patch - Pre-op  Airway Management Planned: Natural Airway  Additional Equipment:   Intra-op Plan:   Post-operative Plan:   Informed Consent: I have reviewed the patients History and Physical, chart, labs and discussed the procedure including the risks, benefits and alternatives for the proposed anesthesia with the patient or authorized representative who has indicated his/her understanding and acceptance.   Dental advisory given  Plan Discussed with:   Anesthesia Plan Comments:         Anesthesia Quick Evaluation

## 2017-09-01 NOTE — Transfer of Care (Signed)
Immediate Anesthesia Transfer of Care Note  Patient: Tina Diaz  Procedure(s) Performed: CESAREAN SECTION (N/A )  Patient Location: PACU  Anesthesia Type:Spinal  Level of Consciousness: awake  Airway & Oxygen Therapy: Patient Spontanous Breathing  Post-op Assessment: Report given to RN  Post vital signs: Reviewed and stable  Last Vitals:  Vitals:   09/01/17 1105  BP: 134/85  Pulse: 90  Resp: 18  Temp: 36.6 C  SpO2: 100%    Last Pain:  Vitals:   09/01/17 1105  TempSrc: Oral         Complications: No apparent anesthesia complications

## 2017-09-01 NOTE — Lactation Note (Signed)
This note was copied from a baby's chart. Lactation Consultation Note  Patient Name: Tina Diaz VHQIO'NToday's Date: 09/01/2017 Reason for consult: Initial assessment;Early term 37-38.6wks;Difficult latch;Primapara;1st time breastfeeding;Other (Comment)(BS 46 & 42)  Initial consult at 9 hrs old; Mom is P1.  GDM on Glyburide during pregnancy.  Infant blood sugars after birth x2 in 8140's. Macrosomia, C-Section with vacuum assist.  Apgars 8 & 9.   Infant has had difficulty with feedings today.  RN called [facility] for consult. Attempt breastfeeding x5 (0-5 min) + EBM via spoon x4 (.5-693ml); voids-1; stools-0.  LS-5 by RN. Infant showing cues when LC entered room.  FOB holding STS.  Mom reclined in chair in corner.  On falls precautions; foley in place with IV in right arm.   RN has been working with mom on hand expressing and spoon feeding today.  RN started #20 nipple shield and stated infant was able to suck better with NS. Mom has flat nipples that evert slightly with stimulation but immediately flattens with compressions.  Attempted feeding on right side in cross-cradle but could not get infant and mom in good position.   Suck training with gloved finger.  Infant would take a few sucks but mostly was biting and playing with finger.  Disorganized suck pattern. Could not get infant into a consistent sucking pattern.   Tried laid-back position with baby more vertical on mom's belly, but still would not latch with depth and pattern suck. Changed position to right football hold, infant became more engaged and would take a few sucks but nipple shield would not stay on. Decreased nipple shield size to #20 for a better fit.  Hand expressed 2 ml colostrum prefilled NS with .5-691ml and infant sucked in a more consistent pattern.  Gave remainder of colostrum via corner of infant's mouth while latched.   LC noted that infant seems to get into a more consistent sucking pattern with positive reinforcement however would  lose latch after colostrum was gone. Gave parents a 5 [place] feeding tube and syringe to use as SNS with next feeding.  Explained will need at least 3-5 ml colostrum to use.  Showed parents how to use under NS. Encouraged hand expressing after this feeding to save for use on next feeding.   Instructed to supplement with each feeding using curved-tip at breast or 5 French to help keep infant in rhythmical sucking motion. RN came into room at end of consult.  Reported feeding plan to RN for tonight.   Suggested waiting until tomorrow afternoon before setting up DEBP.  Mom has HP in room.   Educated on feeding with feeding cues, size of infant's stomach, and cluster feeding.   Lactation brochure given and informed of hospital support group and OP services.  Maternal Data Has patient been taught Hand Expression?: Yes(with return demonstration; large drops of colostrum collected throughout day) Does the patient have breastfeeding experience prior to this delivery?: No  Feeding Feeding Type: Breast Fed Length of feed: 30 min(off and on)  LATCH Score Latch: Repeated attempts needed to sustain latch, nipple held in mouth throughout feeding, stimulation needed to elicit sucking reflex.  Audible Swallowing: A few with stimulation  Type of Nipple: Flat  Comfort (Breast/Nipple): Soft / non-tender  Hold (Positioning): Assistance needed to correctly position infant at breast and maintain latch.  LATCH Score: 6  Interventions Interventions: Breast feeding basics reviewed;Adjust position;Support pillows;Assisted with latch;Skin to skin;Position options;Expressed milk;Hand express;Breast compression  Lactation Tools Discussed/Used Tools: Nipple Shields Nipple  shield size: 16 WIC Program: No   Consult Status Consult Status: Follow-up Date: 09/02/17 Follow-up type: In-patient    Tina Diaz, Tina Diaz 09/01/2017, 10:48 PM

## 2017-09-01 NOTE — Progress Notes (Signed)
No changes to H&P per patient history Reviewed with patient procedure-cesarean section All questions answered Patient states she understands and agrees 

## 2017-09-01 NOTE — Brief Op Note (Signed)
09/01/2017  1:42 PM  PATIENT:  Elizebeth KollerKimberly C Perea  36 y.o. female  PRE-OPERATIVE DIAGNOSIS:  uncontrolled gestational diabetes, macrosomia  POST-OPERATIVE DIAGNOSIS:  uncontrolled gestational diabetes, macrosomia  PROCEDURE:  Procedure(s) with comments: CESAREAN SECTION (N/A) - Primary edc 12/16 allergy to augmentin need RNFA  SURGEON:  Surgeon(s) and Role:    * Harold Hedgeomblin, Jalan Bodi, MD - Primary    * Morris, Megan, DO - Assisting  PHYSICIAN ASSISTANT:   ASSISTANTS: none   ANESTHESIA:   spinal  EBL:  989 mL   BLOOD ADMINISTERED:none  DRAINS: Urinary Catheter (Foley)   LOCAL MEDICATIONS USED:  NONE  SPECIMEN:  Source of Specimen:  placenta  DISPOSITION OF SPECIMEN:  PATHOLOGY  COUNTS:  YES  TOURNIQUET:  * No tourniquets in log *  DICTATION: .Other Dictation: Dictation Number [id number]  PLAN OF CARE: Admit to inpatient   PATIENT DISPOSITION:  PACU - hemodynamically stable.   Delay start of Pharmacological VTE agent (>24hrs) due to surgical blood loss or risk of bleeding: not applicable

## 2017-09-02 LAB — CBC
HCT: 28.2 % — ABNORMAL LOW (ref 36.0–46.0)
Hemoglobin: 9.4 g/dL — ABNORMAL LOW (ref 12.0–15.0)
MCH: 29.9 pg (ref 26.0–34.0)
MCHC: 33.3 g/dL (ref 30.0–36.0)
MCV: 89.8 fL (ref 78.0–100.0)
Platelets: 200 10*3/uL (ref 150–400)
RBC: 3.14 MIL/uL — ABNORMAL LOW (ref 3.87–5.11)
RDW: 14.5 % (ref 11.5–15.5)
WBC: 10.8 10*3/uL — ABNORMAL HIGH (ref 4.0–10.5)

## 2017-09-02 LAB — GLUCOSE, CAPILLARY: Glucose-Capillary: 99 mg/dL (ref 65–99)

## 2017-09-02 MED ORDER — FERROUS SULFATE 325 (65 FE) MG PO TABS
325.0000 mg | ORAL_TABLET | Freq: Two times a day (BID) | ORAL | Status: DC
  Administered 2017-09-02 – 2017-09-04 (×5): 325 mg via ORAL
  Filled 2017-09-02 (×5): qty 1

## 2017-09-02 NOTE — Lactation Note (Signed)
This note was copied from a baby's chart. Lactation Consultation Note  Patient Name: Tina Diaz Today's Date: 09/02/2017 Reason for consult: Follow-up assessment;Early term 37-38.6wks;Difficult latch;1st time breastfeeding  Rn requesting assist with latching.  Mom reports only a drop of colostrum from hand expressing, mom reports this is less than yesterday.  Mom was getting about 3ml to place in nipple shield for feedings.  Mom wants to conside2831mrCarlsbad Surgery Center Gear817-3[facility] Center-Lake PlacLouis Stokes Cleveland Veterans Affairs [phone] Surgery Center MaplewoodVassie Lol832-123Leotis ShamJeaArmandPa85Elise(48J7071maKindred Hospital [phone] GrWake Forest Joint [phone] Florida Rehabilitation InstiVassie Lol534 247Leotis ShamJea54[place] 3ArmandPa93Elise(35J3955maSt Thomas Medical Group Endoscopy Center Gear959Baylor St Lukes [facility] Center - McnairNorth Meridian [phone] Medical CeVassie Lol423-466Leotis ShamJea54C3ArmandPa(986Elise9J3921maGreen Surgery Center Gear([phone])5St [facility] Endoscopy CenCharleston Surgical [phone] Francis HospVassie Lol([phone] ShamJea54[facility] 3ArmandPa41Elise(7Jamie Brookes6orth [place] milk. Mom has only used DEBP once toda6346myAccord Rehabilitaion HospiGear863-8South Coast Global MedicalLaser And Surgical Services At Center For [phone] Eye AssociateVassie Lol574 174Leotis ShamJea54Novamed Surgery C3ArmandPa78Elise(9Jamie Brookes5orthoro LLC16 NS fits better7525m [facility] Laser CenGear719-8Oregon Eye Surgery CenLifecare Hospitals Of South Texas - Mcalle506 [phone] Surgery CeVassie Lol26719Leotis ShamJea54Penn Med3(4ArmandPa51Elise(78J4155maUsc Kenneth Norris, Jr. Cancer HospiGear787-4Surgical Center For ExcePomegranate Health Systems Of [phone] Health Redwood Area HospVassie Lol415-758Leotis ShamJea54Northeast GeorgiaArmandPa63Elise6Jamie Brookes7orthLumpkinels #20 is a better fit with more nipple into nipple sh25miGrant Memorial HospiGear(478) 2[facility] Community Westview [phone] Center For Advanced Eye Care Of LafayetteVassie Lol646 173ArmandPa95Elise8J577maSchwab Rehabilitation CenGear914 6Mimbres Memorial HSaint Barnabas [phone] [place] Surgery CeVassie Lol31733Leotis ShamJea3ArmandPa77Elise(6J6539maLucas County Health CenGear670Mercy Gilbert MedicalFranciscan St Francis Health -[phone] Memorial HospVassie Lol838-839Leotis ShamJea54Coppe3ArmandPa85Elise3Jamie Brookes6orth Centerasts with somewhat widely spaces and slightly cone shaped,  with more tissue under a6751mrNorth Point Surgery Center Gear([phone])0[place] Benson HHealthbridge Children'S [phone] Orthopedic HospVassie Lol([phone] ShamJea54[place]3(ArmandPa62Elise7J4870maRush County Memorial HospiGear(608) 0Centura Health-St Thomas More HNorthwest Regional Surgery [phone] [facility] Marys CaVassie Lol619-339Leotis ShamJea54M3ArmandPa25Elise6J129maTradition Surgery CenGear769-6G And G InternatioMemorial [phone] Mary Surgery CenterVassie Lol602-840Leotis ShamJea54Wisconsin Specia3(6ArmandPa(90Elise8Jamie Brookes0orthter LLC breast anatomy causes difficulty  latching baby with use of NS.  Baby does not suck with deep latch and stops after a few sucks.   Lc assiste2891mdBaylor Surgical Hospital At Fort WoGear(209)2Heartland Cataract And Laser SurgerySouth Pointe [phone] Specialty HospVassie Lol201-508Leotis ShamJea54The Endos3(ArmandPa(910Elise(9J3313maAspen Hills Healthcare CenGear479-2Central Peninsula General HIdaho State [phone] Community HospVassie Lol(820) 298Leotis ShamJea5ArmandPa23Elise8J9246maWellstar Spalding Regional HospiGear910-6[facility] HGreene County [phone] Hospital - Broadway CaVassie Lol443 743Leotis ShamJea543ArmandPa30Elise8Jamie Brookes1orthMed Ctrn NS and baby sucked with a better pattern and then5831m [facility]arton CamGear951-2Samaritan [facility] Lincoln HCentegra Health System - Woodstock [phone] Surgery Center Of Cool SpringsVassie Lol([phone] ShamJea54[place] 3(ArmandPa64Elise8Jamie Brookes6orthospital tip syringe at breast and baby took about <MEA241mSMedical Center HospiGear907-3Sno[place] Valley HPrisma Health Surgery Center [phone]-Knighton Medical CeVassie Lol817 555Leotis ShamJea54Islan3ArmandPa(902Elise2Jamie Brookes6orthter LLC EndoscoCapitaKarmen G32Cyndie 78.409<MEA5585mSBelleair Surgery Center Gear(574) 4[facility] [phone] Grove Ambulatory SurgVassie Lol(864) 459Leotis ShamJea54Harry S. Truman Memo3ArmandPa31Elise6J3060maMadison County Memorial HospiGear340Piney Orchard Surgery CenAlliance [phone] Eye Surgery DecVassie Lol([phone])634Leotis ShamJea54A3(ArmandPa24Elise(6J2242maMc Donough District HospiGear(670)2[place] HCallahan Eye [phone] Hospital For Physical RehabilitaVassie Lol236-499Leotis ShamJea54Saint Cl[facility] - B3(3ArmandPa83Elise9Jamie Brookes9orth CampussellvilleCapitaKarmen 862-Cyndie 78.409<MEASUR1358mECaromont Regional Medical CenGear(409)1Carolina Digestive DiseUniversity [phone] Womens HospVassie Lol775-516Leotis ShamJea543ArmandPa31Elise6Jamie Brookes3orth CenterUREMENT(37 [place]780-Va Medical Center - CapitaKar3566meAmbulatory Surgery Center Of OpelouGear213-3Group Health Eastside HClearview Surgery [phone]-[facility] CaVassie Lol206-415Leotis ShamJea54Integris Bass3ArmandPa62Elise3Jamie Brookes0orth Center [place]434-Digestive Healthcare Of 642mGTmc Healthcare Center For GeropsGear202-1Mangum Regional MedicalWestside Medical Cen636 [phone] Community Memorial HospVassie Lol([phone])092Leotis ShamJea54Prairi3(7ArmandPa86Elise6J821maCaplan Berkeley Gear352-1Springfield HospitalEast Brunswick Surgery [phone] Township District Memorial HospVassie Lol301-539Leotis ShamJea54Bates Co3ArmandPa(40Elise(9J1970maAudie L. Murphy Va Hospita760[place] [phone] Secours-St Francis Xavier HospVassie Lol([phone] ShaOrthopedic (7ArmandPa66Eli62msCentura Health-St Francis Medical CenGear307-0Howard County MedicalWilliams Eye Insti419 [phone] Medical CeVassie Lol(781) 834Leotis ShamJea54Physicians Sur3ArmandPa(61Elise6Jamie Brookes5orthLebanon MouCapitaKarmen GGr21Cyndie 78.409 [place]442-Ka855miAdvanced Care Hospital Of MontGear415-0The Hand And Upper Extremity Surgery Center Of GeorBlue Mountain Hospital Gnaden [phone] Basin Surgical Care CeVassie Lol443-673Leotis ShamJea54G3(5ArmandPa(463Elise6J6094maMarin Ophthalmic Surgery CenGear801 0Lima Memorial HealthJefferson County [phone] Endoscopy AscVassie Lol(763) 021Leotis ShamJea54Co3ArmandPa70Elise3Jamie Brookes6orthospitaltaKarmen GNorth A770-Cyndie 78.40 [place](534) Se7357mlSt Lukes HospiGear980-2Forest Canyon Endoscopy And SurgerySt Joseph Medical [phone] Community Musculoskeletal CeVassie Lol720-260Leotis ShamJea54Ad3ArmandPa91Elise(6Jamie Brookes0orth PlacidpitaKarmen G364-Cyndie 78.409 [place]408 Altus BaytownCapitaKarmen GSolon(971)Cyndie 78.409 [place]1445-Oreg759moNew Iberia Surgery Center Gear647-0Mt [place] Surgical CenOnslow Memorial [phone] Regional Health SyVassie Lol(660) 298Leotis ShamJea54Eye Surgery Cent3ArmandPaElise(25J2851maBhc Streamwood Hospital Behavioral Health CenGear(763) 5Pearl SurgicenCommunity Memorial H660 [phone] Ambulatory Surgery CenterVassie Lol973-162Leotis ShamJea54[facility]oscopy [facility] LLC 3(ArmandPa(57Elise8Jamie Brookes1orthdoscopyen20Cyndie 78.409 Kentuck7241mySentara Leigh HospiGear956-1Benchmark Regional HUrosurgical Center Of Richmon([phone] Wadsworth-Rittman HospVassie Lol715 177Leotis ShamJea54Delta Co3(ArmandPa94Elise2Jamie Brookes8orthospitalrmen 331 Cyndie 78.40 [place]719-Haxtun HospitalCapitaKarmen (629402m)Healthsouth Rehabilitation [facility] Of Forth WoGear234 6Zachary Asc PartnCommunity Howard Regional [phone]. John Broken AVassie Lol380-391Leotis ShamJea54Phys3(ArmandPa98Elise(8Jamie Brookes0orthery CtrT>[place]712-Banner Health [facility] Vista SurgeCapitaKarmen (805)Cyndie 74278m8[facility] Gear618-8Desoto SurgeryMinnesota Valley [phone] AllianceVassie Lol(870) 346Leotis ShamJea54University Of Miam3ArmandPa(60Elise4J9516maTennova Healthcare - JamestGear770Memorial Hermann West Houston Surgery CenPathway Rehabilitation Hospial Of [phone] Surgical CeVassie Lol(515)420Leotis ShamJea54Four Wind3ArmandPa60Elise(6J4979maAndersen Eye Surgery Center Gear207-5[place] County HSt Vincent Brazoria [phone] Rehabilitation HospVassie Lol(203) 441Leotis ShamJea54O3ArmandPa(330Elise(9Jamie Brookes8orthospitalky530-Laguna Treatment HospCapitaKarm320-Cyndie 78.409 [place]509-Orthopedic Surgery Center CapitaKarmen GSpe(440)Cyndie 78.409 [place]46Northern California Surgery CapitaKarmen GRuss669-Cyndie 78.409 [place]671 Sierra Ambulatory SurgeCapitaKarmen GMar737-Cyndie 78.409 [place]318-[facility] LagCapitaKarmen G351 Cyndie 78.409 [place]850-ShandsCapitaKarmen(510)Cyndie 78.409 [place]862-Dearborn Surgery Center LLC D[facility]born SurgeCapitaKarmen (718)Cyndie 78.409 [place]606-Mid - [facility] OfCapitaKarmen GEas93Cyndie 78.409874w2de4263rped when removed. LC used gloved finger and curved tip syringe for suck training and baby tolerated with better tongue mobility and strong suck.     LC encouraged mom to use DEBP every time baby has a feeding of EBM or formula.  MOm to use initiate phase and then try hand expression for a few minutes.  Mom to give all EBM to baby.  If mom collects 5-7mls give to baby at next feeding.  If mom is not collecting EBM mom to supplement with formula 7-10mls each feeding.  Parents to attempt latch with NS either #16 or #20 which ever fits better.  Try to supplement at the breast to keep baby actively sucking.  Parents to finger/syringe feed as needed.  Parents to work on suck training.  Parents are not independent with latching as baby is still not doing well.  Mom aware to call for assist as needed.     Maternal Data Has patient been taught Hand  Expression?: Yes  Feeding Feeding Type: Breast Fed Length of feed: (few minutes of non nutritive sucking )  LATCH Score Latch: Repeated attempts needed to sustain latch, nipple held in mouth throughout feeding, stimulation needed to elicit sucking reflex.  Audible Swallowing: None  Type of Nipple: Flat  Comfort (Breast/Nipple): Filling, red/small blisters or bruises, mild/mod discomfort  Hold (Positioning): Full assist, staff holds infant at breast  LATCH Score: 3  Interventions Interventions: Breast feeding basics reviewed;Assisted with latch;Support pillows;Adjust position;DEBP;Skin to skin;Position options;Breast massage;Expressed milk;Hand express;Pre-pump if needed;Shells;Breast compression  Lactation Tools Discussed/Used Tools: Nipple Shields Nipple shield size: 20 Pump Review: Setup, frequency, and cleaning   Consult Status Consult Status: Follow-up Date: 09/03/17 Follow-up type: In-patient    Christipher Rieger 09/02/2017, 5:16 PM

## 2017-09-02 NOTE — Progress Notes (Signed)
Subjective: Postpartum Day 1: Cesarean Delivery Patient reports tolerating PO and no problems voiding.    Objective: Vital signs in last 24 hours: Temp:  [97.6 F (36.4 C)-98.4 F (36.9 C)] 98 F (36.7 C) (12/05 0745) Pulse Rate:  [62-90] 69 (12/05 0745) Resp:  [16-22] 19 (12/05 0745) BP: (103-137)/(45-103) 125/53 (12/05 0745) SpO2:  [95 %-100 %] 97 % (12/05 0745) Weight:  [336 lb (152.4 kg)] 336 lb (152.4 kg) (12/04 1105)  Physical Exam:  General: alert, cooperative and appears stated age 76Lochia: appropriate Uterine Fundus: firm Incision: healing well, no significant drainage, no dehiscence DVT Evaluation: No evidence of DVT seen on physical exam. Negative Homan's sign. No cords or calf tenderness.  Recent Labs    08/31/17 1130 09/02/17 0518  HGB 11.9* 9.4*  HCT 36.0 28.2*    Assessment/Plan: Status post Cesarean section. Doing well postoperatively.  Continue current care. Acute blood loss on chronic anemia-FeSO4  Tina Diaz 09/02/2017, 8:57 AM

## 2017-09-02 NOTE — Anesthesia Postprocedure Evaluation (Signed)
Anesthesia Post Note  Patient: Elizebeth KollerKimberly C Allington  Procedure(s) Performed: CESAREAN SECTION (N/A )     Patient location during evaluation: Mother Baby Anesthesia Type: Spinal Level of consciousness: awake and alert and oriented Pain management: pain level controlled Vital Signs Assessment: post-procedure vital signs reviewed and stable Respiratory status: spontaneous breathing and nonlabored ventilation Cardiovascular status: stable Postop Assessment: no headache, no apparent nausea or vomiting, no backache, adequate PO intake, spinal receding and patient able to bend at knees Anesthetic complications: no    Last Vitals:  Vitals:   09/02/17 0559 09/02/17 0745  BP: (!) 120/49 (!) 125/53  Pulse: 66 69  Resp: 16 19  Temp: 36.7 C 36.7 C  SpO2: 98% 97%    Last Pain:  Vitals:   09/02/17 0559  TempSrc: Oral  PainSc:    Pain Goal:                 Laban EmperorMalinova,Reality Dejonge Hristova

## 2017-09-02 NOTE — Op Note (Signed)
NAME:  Clement HusbandsHEDRICK, Marvena                 ACCOUNT NO.:  MEDICAL RECORD NO.:  [id number]  LOCATION:                                 FACILITY:  PHYSICIAN:  Guy SandiferJames E. Henderson Cloudomblin, M.D.      DATE OF BIRTH:  DATE OF PROCEDURE:  09/01/2017 DATE OF DISCHARGE:                              OPERATIVE REPORT   PREOPERATIVE DIAGNOSES: 1. Fetal macrosomia. 2. Gestational diabetes.  POSTOPERATIVE DIAGNOSES: 1. Fetal macrosomia. 2. Gestational diabetes.  PROCEDURE:  Low transverse cesarean section.  SURGEON:  Guy SandiferJames E. Henderson Cloudomblin, M.D.  ASSISTANAundra Millet:  Megan Morris, DO.  ANESTHESIA:  Spinal.  ESTIMATED BLOOD LOSS:  1000 mL.  SPECIMENS:  Placenta to Pathology.  FINDINGS:  Viable female infant.  Arterial cord pH 7.18.  Birth weight pending, Apgars pending.  INDICATIONS AND CONSENT:  This patient is a 36 year old, G2, P0, with gestational diabetes at 38-2/7th weeks.  Glycemic control has been fair, but macrosomia has been noted.  After discussion of options, she elects primary cesarean section.  Potential risks and complications were reviewed preoperatively including, but not limited to, infection, organ damage, bleeding requiring transfusion of blood products with HIV and hepatitis acquisition, DVT, PE, pneumonia.  All questions were answered. The patient states she understands and agrees and consent was signed on the chart.  DESCRIPTION OF PROCEDURE:  The patient was taken to the operating room, where she was identified, spinal anesthetic was placed, and she was placed in the dorsal supine position with a 15-degree left lateral wedge.  She was prepped vaginally.  Foley catheter was placed and then she was prepped abdominally.  After 3-minute drying time for the ChloraPrep, she was draped in a sterile fashion.  Time-out was undertaken.  After testing for adequate spinal anesthesia, skin was entered through a Pfannenstiel incision and dissection was carried out in layers to the peritoneum,  which was taken down superiorly and inferiorly.  Vesicouterine peritoneum taken down cephalad laterally. Bladder flap developed.  Bladder blade was placed.  Uterus was incised in a low transverse manner and the uterine cavity was entered bluntly with a hemostat.  Uterine incision was extended cephalad laterally with fingers.  Vacuum extractor was used to elevate the vertex through the incision with no pop-offs.  Remainder of the baby was then delivered without difficulty.  Cord was clamped and cut after 1-minute time and the baby was handed to the awaiting Pediatrics team.  Placenta was manually delivered and sent to Pathology.  Cavity was clean.  Uterus was closed in 2 running locking imbricating layers of 0 Monocryl suture. Additional figure-of-eights were placed in the center to obtain complete hemostasis.  Lavage was carried out.  Anterior peritoneum was closed in a running fashion with 0 Monocryl suture, also used to reapproximate the pyramidalis muscle in the midline.  Anterior rectus fascia was closed in a running fashion with 0 looped PDS.  Subcutaneous layer was closed with interrupted 2-0 plain and the skin was closed in a subcuticular fashion with 4-0 Vicryl on a Mellody DanceKeith.  Steri-Strips, pressure dressing applied. All counts were correct.  The patient was taken to the PACU in stable condition.     Guy SandiferJames E. Henderson Cloudomblin,  M.D.     JET/MEDQ  D:  09/01/2017  T:  09/01/2017  Job:  [id number]

## 2017-09-03 LAB — GLUCOSE, CAPILLARY: Glucose-Capillary: 99 mg/dL (ref 65–99)

## 2017-09-03 NOTE — Progress Notes (Signed)
MOB was referred for history of depression/anxiety. * Referral screened out by Clinical Social Worker because none of the following criteria appear to apply: ~ History of anxiety/depression during this pregnancy, or of post-partum depression. ~ Diagnosis of anxiety and/or depression within last 3 years OR * MOB's symptoms currently being treated with medication and/or therapy.  MOB reports MOB's anxiety/depression was situational; no concerns noted in OB records.  Please contact the Clinical Social Worker if needs arise, by [facility] Surgery Center LLCMOB request, or if MOB scores greater than 9/yes to question 10 on Edinburgh Postpartum Depression Screen.  Blaine HamperAngel Boyd-Gilyard, MSW, LCSW Clinical Social Work [phone]

## 2017-09-03 NOTE — Progress Notes (Signed)
Subjective: Postpartum Day 2: Cesarean Delivery Patient reports tolerating PO, + flatus and no problems voiding.    Objective: Vital signs in last 24 hours: Temp:  [98 F (36.7 C)-98.2 F (36.8 C)] 98 F (36.7 C) (12/06 0553) Pulse Rate:  [71-79] 79 (12/06 0553) Resp:  [18] 18 (12/06 0553) BP: (119-126)/(47-66) 126/66 (12/06 0553) SpO2:  [97 %] 97 % (12/05 1430)  Physical Exam:  General: alert, cooperative, appears stated age and mild distress Lochia: appropriate Uterine Fundus: firm Incision: healing well DVT Evaluation: No evidence of DVT seen on physical exam.  Recent Labs    08/31/17 1130 09/02/17 0518  HGB 11.9* 9.4*  HCT 36.0 28.2*    Assessment/Plan: Status post Cesarean section. Doing well postoperatively.  Continue current care.  Donnamarie Shankles C 09/03/2017, 9:40 AM

## 2017-09-03 NOTE — Lactation Note (Signed)
This note was copied from a baby'Diaz chart. Lactation Consultation Note  Patient Name: Tina Diaz [name] Date: 09/03/2017  Pecola LeisureBaby is at a 9% weight loss at 45 hours old.  Mom states baby is doing much better feeding with nipple shield.  Parents are supplementing with curved tip syringe.  RN instructed them to increase formula volume today.  Mom is post pumping and obtaining a few mls.  Discussed using the 5 french feeding tube/syringe at breast.  Mom agreeable and will call out for assist next feeding.   Maternal Data    Feeding Feeding Type: Formula Length of feed: 10 min  LATCH Score                   Interventions    Lactation Tools Discussed/Used Tools: Nipple Shields Nipple shield size: 16   Consult Status      Tina Diaz 09/03/2017, 11:09 AM

## 2017-09-04 LAB — GLUCOSE, CAPILLARY: Glucose-Capillary: 90 mg/dL (ref 65–99)

## 2017-09-04 MED ORDER — ACETAMINOPHEN 325 MG PO TABS: 650.0000 mg | ORAL_TABLET | Freq: Four times a day (QID) | ORAL | 1 refills | Status: AC | PRN

## 2017-09-04 MED ORDER — IBUPROFEN 600 MG PO TABS: 600.0000 mg | ORAL_TABLET | Freq: Four times a day (QID) | ORAL | 1 refills | Status: DC | PRN

## 2017-09-04 MED ORDER — OXYCODONE HCL 5 MG PO TABS: 5.0000 mg | ORAL_TABLET | Freq: Four times a day (QID) | ORAL | 0 refills | Status: DC | PRN

## 2017-09-04 NOTE — Discharge Summary (Signed)
Obstetric Discharge Summary Reason for Admission: cesarean section Prenatal Procedures: none Intrapartum Procedures: cesarean: low cervical, transverse Postpartum Procedures: none Complications-Operative and Postpartum: none Hemoglobin  Date Value Ref Range Status  09/02/2017 9.4 (L) 12.0 - 15.0 g/dL Final    Comment:    REPEATED TO VERIFY DELTA CHECK NOTED    Hemoglobin, fingerstick  Date Value Ref Range Status  02/08/2015 13.6 12.0 - 16.0 g/dL Final   HCT  Date Value Ref Range Status  09/02/2017 28.2 (L) 36.0 - 46.0 % Final    Physical Exam:  General: alert, cooperative and no distress Lochia: appropriate Uterine Fundus: firm Incision: healing well DVT Evaluation: No evidence of DVT seen on physical exam.  Discharge Diagnoses: Term Pregnancy-delivered  Discharge Information: Date: 09/04/2017 Activity: pelvic rest Diet: routine Medications: PNV, Ibuprofen and oxycodone Condition: stable Instructions: refer to practice specific booklet Discharge to: home   Newborn Data: Live born female  Birth Weight: 8 lb 14.5 oz (4040 g) APGAR: 8, 9  Newborn Delivery   Birth date/time:  09/01/2017 13:13:00 Delivery type:  C-Section, Vacuum Assisted C-section categorization:  Primary     Home with mother.  Roselle LocusJames E Phoebie Shad II 09/04/2017, 9:09 AM

## 2017-09-04 NOTE — Lactation Note (Addendum)
This note was copied from a baby's chart. 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 Mom has EBM at bedside, and reports that her EBM volumes are starting to increase. Parents report that they feel comfortable with putting the baby to breast with cues, then supplementing with EBM/formula (25-30 ml for now) using 5Fr feeding system, and then mom post-pumps followed by hand expression. Mom had questions about how to know if baby getting enough at breast when her milk comes to volume. Discussed progression of milk coming to volume, swallows at breast, breast softening during BF, output/diapers and weight gain. Discussed the need to keep pumping while using NS, and methods of moving away from its use. Mom aware of OP/BFSG and LC phone line assistance after D/C.  Referred parents to EBM storage guidelines in "Mother and Baby Care" booklet. Discussed transition of stools, and engorgement prevention/treatment. Mom given single and double SNS and enc to make OP appointment as needed.   Maternal Data    Feeding Feeding Type: Breast Milk with Formula added Length of feed: 15 min  LATCH Score                   Interventions Interventions: Breast feeding basics reviewed  Lactation Tools Discussed/Used     Consult Status Consult Status: PRN    Tina Diaz Diaz, 10:15 AM

## 2017-09-04 NOTE — Lactation Note (Deleted)
This note was copied from a baby's chart. Lactation Consultation Note  Patient Name: Girl Clement HusbandsKimberly Villaflor VWUJW'JToday's Date: 09/04/2017 Reason for consult: Follow-up assessment Type of Endocrine Disorder?: PCOS  Baby 69 hours old. Mom had baby latching when this LC entered the room. Baby latched with lips flanged and suckled rhythmically with intermittent swallows noted. However, mom had baby hanging from breast and reports nipple soreness. Demonstrated how to lift baby up and support baby at the breast to maintain a deep latch--and mom reported increased comfort. Mom easily expressible with colostrum flowing bilaterally. Mom aware of OP/BFSG and LC phone line assistance after D/C.    Maternal Data    Feeding Feeding Type: Breast Fed Length of feed: 15 min  LATCH Score Latch: Grasps breast easily, tongue down, lips flanged, rhythmical sucking.  Audible Swallowing: Spontaneous and intermittent  Type of Nipple: Everted at rest and after stimulation  Comfort (Breast/Nipple): Filling, red/small blisters or bruises, mild/mod discomfort  Hold (Positioning): Assistance needed to correctly position infant at breast and maintain latch.  LATCH Score: 8  Interventions Interventions: Breast feeding basics reviewed;Skin to skin;Adjust position  Lactation Tools Discussed/Used     Consult Status Consult Status: PRN    Sherlyn HayJennifer D Zalayah Pizzuto 09/04/2017, 10:55 AM

## 2019-02-16 ENCOUNTER — Other Ambulatory Visit: Payer: Self-pay | Admitting: Family Medicine

## 2019-02-16 DIAGNOSIS — N63 Unspecified lump in unspecified breast: Secondary | ICD-10-CM

## 2019-03-07 ENCOUNTER — Ambulatory Visit
Admission: RE | Admit: 2019-03-07 | Discharge: 2019-03-07 | Disposition: A | Discharge status: Home / Self Care | Payer: BC Managed Care – PPO | Source: Ambulatory Visit | Attending: Family Medicine | Admitting: Family Medicine

## 2019-03-07 ENCOUNTER — Other Ambulatory Visit: Payer: Self-pay

## 2019-03-07 ENCOUNTER — Other Ambulatory Visit: Payer: Self-pay | Admitting: Family Medicine

## 2019-03-07 DIAGNOSIS — N63 Unspecified lump in unspecified breast: Secondary | ICD-10-CM

## 2019-09-08 ENCOUNTER — Other Ambulatory Visit: Payer: BC Managed Care – PPO

## 2019-09-14 ENCOUNTER — Other Ambulatory Visit: Payer: Self-pay | Admitting: Family Medicine

## 2019-09-14 ENCOUNTER — Ambulatory Visit
Admission: RE | Admit: 2019-09-14 | Discharge: 2019-09-14 | Disposition: A | Discharge status: Home / Self Care | Payer: BC Managed Care – PPO | Source: Ambulatory Visit | Attending: Family Medicine | Admitting: Family Medicine

## 2019-09-14 ENCOUNTER — Other Ambulatory Visit: Payer: Self-pay

## 2019-09-14 DIAGNOSIS — N6320 Unspecified lump in the left breast, unspecified quadrant: Secondary | ICD-10-CM

## 2019-09-14 DIAGNOSIS — N63 Unspecified lump in unspecified breast: Secondary | ICD-10-CM

## 2019-09-16 ENCOUNTER — Other Ambulatory Visit: Payer: Self-pay

## 2019-09-16 ENCOUNTER — Ambulatory Visit
Admission: RE | Admit: 2019-09-16 | Discharge: 2019-09-16 | Disposition: A | Discharge status: Home / Self Care | Payer: BC Managed Care – PPO | Source: Ambulatory Visit | Attending: Family Medicine | Admitting: Family Medicine

## 2019-09-16 ENCOUNTER — Other Ambulatory Visit: Payer: Self-pay | Admitting: Family Medicine

## 2019-09-16 DIAGNOSIS — N6320 Unspecified lump in the left breast, unspecified quadrant: Secondary | ICD-10-CM

## 2019-10-13 LAB — OB RESULTS CONSOLE ABO/RH: RH Type: POSITIVE

## 2019-10-13 LAB — OB RESULTS CONSOLE HIV ANTIBODY (ROUTINE TESTING): HIV: NONREACTIVE

## 2019-10-13 LAB — OB RESULTS CONSOLE ANTIBODY SCREEN: Antibody Screen: NEGATIVE

## 2019-10-13 LAB — OB RESULTS CONSOLE GC/CHLAMYDIA
Chlamydia: NEGATIVE
Gonorrhea: NEGATIVE

## 2019-10-13 LAB — OB RESULTS CONSOLE HEPATITIS B SURFACE ANTIGEN: Hepatitis B Surface Ag: NEGATIVE

## 2019-10-13 LAB — OB RESULTS CONSOLE RPR: RPR: NONREACTIVE

## 2019-10-13 LAB — OB RESULTS CONSOLE RUBELLA ANTIBODY, IGM: Rubella: IMMUNE

## 2019-11-21 ENCOUNTER — Other Ambulatory Visit: Payer: Self-pay

## 2019-12-05 ENCOUNTER — Other Ambulatory Visit: Payer: Self-pay

## 2020-01-12 ENCOUNTER — Telehealth: Payer: BC Managed Care – PPO | Admitting: Emergency Medicine

## 2020-01-12 DIAGNOSIS — R05 Cough: Secondary | ICD-10-CM | POA: Diagnosis not present

## 2020-01-12 DIAGNOSIS — R059 Cough, unspecified: Secondary | ICD-10-CM

## 2020-01-12 MED ORDER — ALBUTEROL SULFATE HFA 108 (90 BASE) MCG/ACT IN AERS: 2.0000 | INHALATION_SPRAY | RESPIRATORY_TRACT | 0 refills | Status: AC | PRN

## 2020-01-12 MED ORDER — CETIRIZINE HCL 10 MG PO TABS: 10.0000 mg | ORAL_TABLET | Freq: Every day | ORAL | 0 refills | Status: AC

## 2020-01-12 NOTE — Addendum Note (Signed)
Addended by: Roxy Horseman B on: 01/12/2020 07:18 PM   Modules accepted: Orders

## 2020-01-12 NOTE — Progress Notes (Signed)
We are sorry that you are not feeling well.  Here is how we plan to help!  Based on your presentation I believe you most likely have A cough due to a virus.  This is called viral bronchitis and is best treated by rest, plenty of fluids and control of the cough.  You may use Tylenol as directed to help your symptoms.     Allergies could also be contributing to your symptoms.  You have extremely limited options with regard to cough medicines in pregnancy.  Most cough medicines are rated pregnancy category C by the FDA, which means "Animal reproduction studies have shown an adverse effect on the fetus and there are no adequate and well-controlled studies in humans, but potential benefits may warrant use of the drug in pregnant women despite potential risks."  I would suggest trying to control your symptoms by treating post-nasal drip and allergies that can exacerbate the cough by taking Zyrtec.  I've sent a prescription to your pharmacy, but it can be obtained over-the-counter.  You may also want to ask your OB about Mucinex (expectorant) and Delsym (suppressant).  Many OBs are comfortable with their patient's taking these cough medicines.  From your responses in the eVisit questionnaire you describe inflammation in the upper respiratory tract which is causing a significant cough.  This is commonly called Bronchitis and has four common causes:    Allergies  Viral Infections  Acid Reflux  Bacterial Infection Allergies, viruses and acid reflux are treated by controlling symptoms or eliminating the cause. An example might be a cough caused by taking certain blood pressure medications. You stop the cough by changing the medication. Another example might be a cough caused by acid reflux. Controlling the reflux helps control the cough.  USE OF BRONCHODILATOR ("RESCUE") INHALERS: There is a risk from using your bronchodilator too frequently.  The risk is that over-reliance on a medication which only  relaxes the muscles surrounding the breathing tubes can reduce the effectiveness of medications prescribed to reduce swelling and congestion of the tubes themselves.  Although you feel brief relief from the bronchodilator inhaler, your asthma may actually be worsening with the tubes becoming more swollen and filled with mucus.  This can delay other crucial treatments, such as oral steroid medications. If you need to use a bronchodilator inhaler daily, several times per day, you should discuss this with your provider.  There are probably better treatments that could be used to keep your asthma under control.     HOME CARE . Only take medications as instructed by your medical team. . Complete the entire course of an antibiotic. . Drink plenty of fluids and get plenty of rest. . Avoid close contacts especially the very young and the elderly . Cover your mouth if you cough or cough into your sleeve. . Always remember to wash your hands . A steam or ultrasonic humidifier can help congestion.   GET HELP RIGHT AWAY IF: . You develop worsening fever. . You become short of breath . You cough up blood. . Your symptoms persist after you have completed your treatment plan MAKE SURE YOU   Understand these instructions.  Will watch your condition.  Will get help right away if you are not doing well or get worse.  Your e-visit answers were reviewed by a board certified advanced clinical practitioner to complete your personal care plan.  Depending on the condition, your plan could have included both over the counter or prescription medications. If there  is a problem please reply  once you have received a response from your provider. Your safety is important to [place].  If you have drug allergies check your prescription carefully.    You can use MyChart to ask questions about today's visit, request a non-urgent call back, or ask for a work or school excuse for 24 hours related to this e-Visit. If it has been  greater than 24 hours you will need to follow up with your provider, or enter a new e-Visit to address those concerns. You will get an e-mail in the next two days asking about your experience.  I hope that your e-visit has been valuable and will speed your recovery. Thank you for using e-visits.  Greater than 5 minutes, yet less than 10 minutes was used in reviewing the patient's chart, questionnaire, prescribing medications, and documentation for this visit.

## 2020-01-22 ENCOUNTER — Telehealth: Payer: BC Managed Care – PPO | Admitting: Family

## 2020-01-22 DIAGNOSIS — J069 Acute upper respiratory infection, unspecified: Secondary | ICD-10-CM

## 2020-01-22 DIAGNOSIS — J019 Acute sinusitis, unspecified: Secondary | ICD-10-CM

## 2020-01-22 MED ORDER — CLINDAMYCIN HCL 300 MG PO CAPS: 300.0000 mg | ORAL_CAPSULE | Freq: Two times a day (BID) | ORAL | 0 refills | Status: DC

## 2020-01-22 MED ORDER — FLUTICASONE PROPIONATE 50 MCG/ACT NA SUSP: 2.0000 | Freq: Every day | NASAL | 6 refills | Status: AC

## 2020-01-22 NOTE — Addendum Note (Signed)
Addended by: Jannifer Rodney A on: 01/22/2020 05:53 PM   Modules accepted: Orders

## 2020-01-22 NOTE — Progress Notes (Signed)
We are sorry you are not feeling well.  Here is how we plan to help!  Based on what you have shared with me, it looks like you may have a viral upper respiratory infection.  Upper respiratory infections are caused by a large number of viruses; however, rhinovirus is the most common cause.   Symptoms vary from person to person, with common symptoms including sore throat, cough, fatigue or lack of energy and feeling of general discomfort.  A low-grade fever of up to 100.4 may present, but is often uncommon.  Symptoms vary however, and are closely related to a person's age or underlying illnesses.  The most common symptoms associated with an upper respiratory infection are nasal discharge or congestion, cough, sneezing, headache and pressure in the ears and face.  These symptoms usually persist for about 3 to 10 days, but can last up to 2 weeks.  It is important to know that upper respiratory infections do not cause serious illness or complications in most cases.    Upper respiratory infections can be transmitted from person to person, with the most common method of transmission being a person's hands.  The virus is able to live on the skin and can infect other persons for up to 2 hours after direct contact.  Also, these can be transmitted when someone coughs or sneezes; thus, it is important to cover the mouth to reduce this risk.  To keep the spread of the illness at bay, good hand hygiene is very important.  This is an infection that is most likely caused by a virus. There are no specific treatments other than to help you with the symptoms until the infection runs its course.  We are sorry you are not feeling well.  Here is how we plan to help!   For nasal congestion, you may use an oral decongestants such as Mucinex D or if you have glaucoma or high blood pressure use plain Mucinex.  Saline nasal spray or nasal drops can help and can safely be used as often as needed for congestion.  For your congestion,  I have prescribed Fluticasone nasal spray one spray in each nostril twice a day  If you do not have a history of heart disease, hypertension, diabetes or thyroid disease, prostate/bladder issues or glaucoma, you may also use Sudafed to treat nasal congestion.  It is highly recommended that you consult with a pharmacist or your primary care physician to ensure this medication is safe for you to take.     Given your symptoms have only started 5 days, we will hold off on antibiotics at this time. If over the next 2-3 days your symptoms have not resolved you can contact us and we will send in antibiotics. .   If you have a sore or scratchy throat, use a saltwater gargle-  to  teaspoon of salt dissolved in a 4-ounce to 8-ounce glass of warm water.  Gargle the solution for approximately 15-30 seconds and then spit.  It is important not to swallow the solution.  You can also use throat lozenges/cough drops and Chloraseptic spray to help with throat pain or discomfort.  Warm or cold liquids can also be helpful in relieving throat pain.  For headache, pain or general discomfort, you can use Ibuprofen or Tylenol as directed.   Some authorities believe that zinc sprays or the use of Echinacea may shorten the course of your symptoms.   HOME CARE . Only take medications as instructed by your  medical team. . Be sure to drink plenty of fluids. Water is fine as well as fruit juices, sodas and electrolyte beverages. You may want to stay away from caffeine or alcohol. If you are nauseated, try taking small sips of liquids. How do you know if you are getting enough fluid? Your urine should be a pale yellow or almost colorless. . Get rest. . Taking a steamy shower or using a humidifier may help nasal congestion and ease sore throat pain. You can place a towel over your head and breathe in the steam from hot water coming from a faucet. . Using a saline nasal spray works much the same way. . Cough drops, hard candies  and sore throat lozenges may ease your cough. . Avoid close contacts especially the very young and the elderly . Cover your mouth if you cough or sneeze . Always remember to wash your hands.   GET HELP RIGHT AWAY IF: . You develop worsening fever. . If your symptoms do not improve within 10 days . You develop yellow or green discharge from your nose over 3 days. . You have coughing fits . You develop a severe head ache or visual changes. . You develop shortness of breath, difficulty breathing or start having chest pain . Your symptoms persist after you have completed your treatment plan  MAKE SURE YOU   Understand these instructions.  Will watch your condition.  Will get help right away if you are not doing well or get worse.  Your e-visit answers were reviewed by a board certified advanced clinical practitioner to complete your personal care plan. Depending upon the condition, your plan could have included both over the counter or prescription medications. Please review your pharmacy choice. If there is a problem, you may call our nursing hot line at and have the prescription routed to another pharmacy. Your safety is important to [place]. If you have drug allergies check your prescription carefully.   You can use MyChart to ask questions about today's visit, request a non-urgent call back, or ask for a work or school excuse for 24 hours related to this e-Visit. If it has been greater than 24 hours you will need to follow up with your provider, or enter a new e-Visit to address those concerns. You will get an e-mail in the next two days asking about your experience.  I hope that your e-visit has been valuable and will speed your recovery. Thank you for using e-visits.  Approximately 5 minutes was spent documenting and reviewing patient's chart.

## 2020-01-24 ENCOUNTER — Other Ambulatory Visit (HOSPITAL_COMMUNITY): Payer: Self-pay | Admitting: Obstetrics and Gynecology

## 2020-01-24 DIAGNOSIS — Z3492 Encounter for supervision of normal pregnancy, unspecified, second trimester: Secondary | ICD-10-CM

## 2020-02-13 ENCOUNTER — Encounter: Payer: Self-pay | Admitting: *Deleted

## 2020-02-14 ENCOUNTER — Ambulatory Visit (HOSPITAL_COMMUNITY): Payer: BC Managed Care – PPO

## 2020-02-15 ENCOUNTER — Other Ambulatory Visit: Payer: Self-pay

## 2020-02-15 ENCOUNTER — Other Ambulatory Visit: Payer: Self-pay | Admitting: *Deleted

## 2020-02-15 ENCOUNTER — Ambulatory Visit (HOSPITAL_COMMUNITY): Payer: BC Managed Care – PPO | Attending: Obstetrics and Gynecology

## 2020-02-15 ENCOUNTER — Ambulatory Visit: Payer: BC Managed Care – PPO | Admitting: *Deleted

## 2020-02-15 VITALS — BP 129/73 | HR 110

## 2020-02-15 DIAGNOSIS — Z3492 Encounter for supervision of normal pregnancy, unspecified, second trimester: Secondary | ICD-10-CM | POA: Insufficient documentation

## 2020-02-15 DIAGNOSIS — O0990 Supervision of high risk pregnancy, unspecified, unspecified trimester: Secondary | ICD-10-CM | POA: Diagnosis present

## 2020-02-15 DIAGNOSIS — Z363 Encounter for antenatal screening for malformations: Secondary | ICD-10-CM

## 2020-02-15 DIAGNOSIS — O34219 Maternal care for unspecified type scar from previous cesarean delivery: Secondary | ICD-10-CM

## 2020-02-15 DIAGNOSIS — O10012 Pre-existing essential hypertension complicating pregnancy, second trimester: Secondary | ICD-10-CM

## 2020-02-15 DIAGNOSIS — O99212 Obesity complicating pregnancy, second trimester: Secondary | ICD-10-CM

## 2020-02-15 DIAGNOSIS — O09522 Supervision of elderly multigravida, second trimester: Secondary | ICD-10-CM | POA: Diagnosis not present

## 2020-02-15 DIAGNOSIS — O09529 Supervision of elderly multigravida, unspecified trimester: Secondary | ICD-10-CM

## 2020-02-15 DIAGNOSIS — O09292 Supervision of pregnancy with other poor reproductive or obstetric history, second trimester: Secondary | ICD-10-CM | POA: Diagnosis not present

## 2020-02-15 DIAGNOSIS — Z3A26 26 weeks gestation of pregnancy: Secondary | ICD-10-CM

## 2020-03-14 ENCOUNTER — Other Ambulatory Visit: Payer: Self-pay

## 2020-03-14 ENCOUNTER — Ambulatory Visit: Payer: BC Managed Care – PPO | Attending: Maternal & Fetal Medicine

## 2020-03-14 ENCOUNTER — Ambulatory Visit: Payer: BC Managed Care – PPO | Admitting: *Deleted

## 2020-03-14 VITALS — BP 141/77 | HR 104

## 2020-03-14 DIAGNOSIS — O34219 Maternal care for unspecified type scar from previous cesarean delivery: Secondary | ICD-10-CM

## 2020-03-14 DIAGNOSIS — O99213 Obesity complicating pregnancy, third trimester: Secondary | ICD-10-CM

## 2020-03-14 DIAGNOSIS — O09529 Supervision of elderly multigravida, unspecified trimester: Secondary | ICD-10-CM | POA: Insufficient documentation

## 2020-03-14 DIAGNOSIS — O10013 Pre-existing essential hypertension complicating pregnancy, third trimester: Secondary | ICD-10-CM

## 2020-03-14 DIAGNOSIS — O09293 Supervision of pregnancy with other poor reproductive or obstetric history, third trimester: Secondary | ICD-10-CM

## 2020-03-14 DIAGNOSIS — O09523 Supervision of elderly multigravida, third trimester: Secondary | ICD-10-CM

## 2020-03-14 DIAGNOSIS — Z3A30 30 weeks gestation of pregnancy: Secondary | ICD-10-CM

## 2020-03-14 DIAGNOSIS — Z362 Encounter for other antenatal screening follow-up: Secondary | ICD-10-CM | POA: Diagnosis not present

## 2020-04-02 ENCOUNTER — Inpatient Hospital Stay (HOSPITAL_COMMUNITY): Payer: BC Managed Care – PPO

## 2020-04-02 ENCOUNTER — Encounter (HOSPITAL_COMMUNITY): Payer: Self-pay | Admitting: Obstetrics and Gynecology

## 2020-04-02 ENCOUNTER — Encounter (HOSPITAL_COMMUNITY): Payer: Self-pay | Admitting: Emergency Medicine

## 2020-04-02 ENCOUNTER — Other Ambulatory Visit: Payer: Self-pay

## 2020-04-02 ENCOUNTER — Emergency Department (HOSPITAL_COMMUNITY)
Admission: EM | Admit: 2020-04-02 | Discharge: 2020-04-02 | Disposition: A | Discharge status: Home / Self Care | Payer: BC Managed Care – PPO | Attending: Emergency Medicine | Admitting: Emergency Medicine

## 2020-04-02 ENCOUNTER — Emergency Department (EMERGENCY_DEPARTMENT_HOSPITAL)
Admission: AD | Admit: 2020-04-02 | Discharge: 2020-04-03 | Disposition: A | Discharge status: Home / Self Care | Payer: BC Managed Care – PPO | Source: Home / Self Care | Attending: Emergency Medicine | Admitting: Emergency Medicine

## 2020-04-02 DIAGNOSIS — Z7984 Long term (current) use of oral hypoglycemic drugs: Secondary | ICD-10-CM | POA: Insufficient documentation

## 2020-04-02 DIAGNOSIS — Z3A33 33 weeks gestation of pregnancy: Secondary | ICD-10-CM | POA: Insufficient documentation

## 2020-04-02 DIAGNOSIS — M791 Myalgia, unspecified site: Secondary | ICD-10-CM

## 2020-04-02 DIAGNOSIS — Z20822 Contact with and (suspected) exposure to covid-19: Secondary | ICD-10-CM | POA: Insufficient documentation

## 2020-04-02 DIAGNOSIS — O26893 Other specified pregnancy related conditions, third trimester: Secondary | ICD-10-CM | POA: Insufficient documentation

## 2020-04-02 DIAGNOSIS — Z79899 Other long term (current) drug therapy: Secondary | ICD-10-CM | POA: Diagnosis not present

## 2020-04-02 DIAGNOSIS — R05 Cough: Secondary | ICD-10-CM | POA: Insufficient documentation

## 2020-04-02 DIAGNOSIS — M549 Dorsalgia, unspecified: Secondary | ICD-10-CM

## 2020-04-02 DIAGNOSIS — O99891 Other specified diseases and conditions complicating pregnancy: Secondary | ICD-10-CM

## 2020-04-02 DIAGNOSIS — R109 Unspecified abdominal pain: Secondary | ICD-10-CM | POA: Insufficient documentation

## 2020-04-02 DIAGNOSIS — R0789 Other chest pain: Secondary | ICD-10-CM | POA: Insufficient documentation

## 2020-04-02 DIAGNOSIS — S2341XA Sprain of ribs, initial encounter: Secondary | ICD-10-CM

## 2020-04-02 DIAGNOSIS — J189 Pneumonia, unspecified organism: Secondary | ICD-10-CM

## 2020-04-02 HISTORY — DX: Encounter for supervision of normal pregnancy, unspecified, unspecified trimester: Z34.90

## 2020-04-02 LAB — URINALYSIS, ROUTINE W REFLEX MICROSCOPIC
Glucose, UA: NEGATIVE mg/dL
Hgb urine dipstick: NEGATIVE
Ketones, ur: NEGATIVE mg/dL
Leukocytes,Ua: NEGATIVE
Nitrite: NEGATIVE
Protein, ur: 30 mg/dL — AB
Specific Gravity, Urine: 1.033 — ABNORMAL HIGH (ref 1.005–1.030)
pH: 5 (ref 5.0–8.0)

## 2020-04-02 MED ORDER — CYCLOBENZAPRINE HCL 10 MG PO TABS
10.0000 mg | ORAL_TABLET | Freq: Once | ORAL | Status: AC
Start: 2020-04-02 — End: 2020-04-02
  Administered 2020-04-02: 10 mg via ORAL
  Filled 2020-04-02: qty 1

## 2020-04-02 MED ORDER — CYCLOBENZAPRINE HCL 10 MG PO TABS: 10.0000 mg | ORAL_TABLET | Freq: Three times a day (TID) | ORAL | 0 refills | Status: DC | PRN

## 2020-04-02 MED ORDER — LIDOCAINE 5 % EX PTCH
1.0000 | MEDICATED_PATCH | CUTANEOUS | Status: DC
  Administered 2020-04-02: 1 via TRANSDERMAL
  Filled 2020-04-02: qty 1

## 2020-04-02 MED ORDER — LIDOCAINE 5 % EX PTCH: 1.0000 | MEDICATED_PATCH | CUTANEOUS | 0 refills | Status: DC

## 2020-04-02 MED ORDER — ACETAMINOPHEN 500 MG PO TABS
1000.0000 mg | ORAL_TABLET | Freq: Once | ORAL | Status: AC
  Administered 2020-04-03: 1000 mg via ORAL
  Filled 2020-04-02: qty 2

## 2020-04-02 NOTE — ED Provider Notes (Signed)
[facility] EMERGENCY DEPARTMENT Provider Note   CSN: [id number] Arrival date & time: 04/02/20  1035   History Chief Complaint  Patient presents with  . Cough  . Back Pain    Tina Diaz is a 39 y.o. female who presents with back pain. She is [redacted] weeks pregnant and her OBGYN is Dr. Velvet Bathe with [facility]. Pt states she's had a cold for the past 2 weeks with a bad cough. She was tested for COVID and it was negative. She states she had some shortness of breath with it as well and some right sided back pain with coughing. Last night she had a bad coughing spell and she felt a pop over the right rib. The pain is sharp and severe. She took Tylenol without significant relief. She denies abdominal pain, urinary symptoms. She denies fevers and feels like her URI symptoms are improving.   HPI  Past Medical History:  Diagnosis Date  . Anxiety   . Depression    situational  . History of PCOS    with hirtsutism  . Impaired glucose tolerance   . Morbid obesity (HCC)   . Pregnant     Patient Active Problem List   Diagnosis Date Noted  . 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 Impaired glucose tolerance test 07/06/2017    Past Surgical History:  Procedure Laterality Date  . CESAREAN SECTION N/A 09/01/2017   Procedure: CESAREAN SECTION;  Surgeon: Harold Hedge, MD;  Location: [facility] B[facility]ING [facility];  Service: Obstetrics;  Laterality: N/A;  Primary edc 12/16 allergy to augmentin need RNFA     OB History    Gravida  3   Para  1   Term  1   Preterm  0   AB  1   Living  1     SAB  1   TAB  0   Ectopic  0   Multiple  0   Live Births  1           Family History  Problem Relation Age of Onset  . Diabetes Father   . Hypertension Father   . Heart disease Father   . Ovarian cancer Maternal Grandmother 40  . Cancer Maternal Grandmother        bile duct  . Heart disease Maternal Grandfather   . Hypertension Maternal Grandfather   .  Diabetes Paternal Grandmother   . Diabetes Paternal Grandfather     Social History   Tobacco Use  . Smoking status: Never Smoker  . Smokeless tobacco: Never Used  Vaping Use  . Vaping Use: Never used  Substance Use Topics  . Alcohol use: Not Currently    Alcohol/week: 0.0 - 1.0 standard drinks    Comment: social, none with pregnancy  . Drug use: No    Home Medications Prior to Admission medications   Medication Sig Start Date End Date Taking? Authorizing Provider  acetaminophen (TYLENOL) 325 MG tablet Take 2 tablets (650 mg total) by mouth every 6 (six) hours as needed for mild pain (for pain scale < 4). 09/04/17   Harold Hedge, MD  albuterol (VENTOLIN HFA) 108 (90 Base) MCG/ACT inhaler Inhale 2 puffs into the lungs every 4 (four) hours as needed for wheezing or shortness of breath. 01/12/20   Roxy Horseman, PA-C  cetirizine (ZYRTEC ALLERGY) 10 MG tablet Take 1 tablet (10 mg total) by mouth daily. 01/12/20   Roxy Horseman, PA-C  clindamycin (CLEOCIN) 300 MG capsule Take 1 capsule (300  mg total) by mouth in the morning and at bedtime. Patient not taking: Reported on 02/15/2020 01/22/20   Junie Spencer, FNP  fluticasone [facility]) 50 MCG/ACT nasal spray Place 2 sprays into both nostrils daily. 01/22/20   Junie Spencer, FNP  Prenatal Vit-Fe Fum-FA-Omega (ONE-A-DAY WOMENS PRENATAL) 28-0.8 & 440 MG MISC Take 1 tablet by mouth every evening.     [provider]  sertraline (ZOLOFT) 100 MG tablet Take 100 mg by mouth every evening.  05/02/16   [provider]    Allergies    Augmentin [amoxicillin-pot clavulanate]  Review of Systems   Review of Systems  Constitutional: Negative for fever.  Respiratory: Positive for cough. Negative for shortness of breath.   Cardiovascular: Positive for chest pain (rib).  Gastrointestinal: Negative for abdominal pain.  Genitourinary: Negative for pelvic pain, vaginal bleeding and vaginal discharge.  All other systems reviewed  and are negative.   Physical Exam Updated Vital Signs BP 136/81 (BP Location: Right Arm)   Pulse (!) 122   Temp 98.4 F (36.9 C) (Oral)   Resp 20   Ht 5\' 11"  (1.803 m)   Wt (!) 153.8 kg   LMP 08/11/2019   SpO2 97%   BMI 47.28 kg/m   Physical Exam Vitals and nursing note reviewed.  Constitutional:      General: She is not in acute distress.    Appearance: She is well-developed. She is obese. She is not ill-appearing.  HENT:     Head: Normocephalic and atraumatic.  Eyes:     General: No scleral icterus.       Right eye: No discharge.        Left eye: No discharge.     Conjunctiva/sclera: Conjunctivae normal.     Pupils: Pupils are equal, round, and reactive to light.  Cardiovascular:     Rate and Rhythm: Tachycardia present.  Pulmonary:     Effort: Pulmonary effort is normal. No respiratory distress.     Breath sounds: Normal breath sounds.     Comments: No bruising or crepitus Chest:     Chest wall: Tenderness (right lateral rib) present.  Abdominal:     General: There is no distension.  Musculoskeletal:     Cervical back: Normal range of motion.  Skin:    General: Skin is warm and dry.  Neurological:     Mental Status: She is alert and oriented to person, place, and time.  Psychiatric:        Behavior: Behavior normal.     ED Results / Procedures / Treatments   Labs (all labs ordered are listed, but only abnormal results are displayed) Labs Reviewed - No data to display  EKG None  Radiology No results found.  Procedures Procedures (including critical care time)  Medications Ordered in ED Medications - No data to display  ED Course  I have reviewed the triage vital signs and the nursing notes.  Pertinent labs & imaging results that were available during my care of the patient were reviewed by me and considered in my medical decision making (see chart for details).  39 year old female presents with severe R lateral rib pain after coughing spell  yesterday. She is tachycardic here which is likely pain related. At rest her HR is in the high 90s and with movement it is in the 110s. There is no hypoxia. Lungs are CTA. She is tender over the rib cage with palpation without crepitus. Likely she has an acute muscle/rib  strain from cough. Advised against CXR as this will not change management and she is agreeable. Will discuss with OBGYN regarding pain management  Discussed with Dr. Vincente Poli with [facility]. She agrees that CXR is not recommended. She recommends short course of Flexeril 10mg  TID. We can also try topical lidocaine and continue tylenol. Advised f/u with OBGYN   MDM Rules/Calculators/A&P                          Final Clinical Impression(s) / ED Diagnoses Final diagnoses:  Sprain of costal cartilage, initial encounter    Rx / DC Orders ED Discharge Orders    None       , PA-C 04/02/20 1355    06/03/20, MD 04/05/20 [phone]

## 2020-04-02 NOTE — ED Triage Notes (Signed)
Pt. Is 8 months, , due aug 19,

## 2020-04-02 NOTE — Progress Notes (Signed)
Spoke with Dr. Vincente Poli. Pt's FHR tracing is a category 1 with no uc's tracing. No vaginal bleeding or leaking of fluid. Pt is OB cleared.. Dr. Vincente Poli has already spoken with the ED PA to discuss the pt's plan of care.

## 2020-04-02 NOTE — MAU Note (Signed)
PT SAYS SHE WAS AT [place] TODAY. HAS A  COUGH AND CHEST PAIN - WITH COUGH. HAD COVID  TEST- 2 WEEKS AGO - NEG.  HAS BEEN TAKING MUCINEX. NOW FEELS PAIN ON LEFT SIDE UNDER RIBS .  Sunday NIGHT HEARD SOMETHING POP AT 6PM-.    TODAY - HURTS TO MOVE . RX- FLEXERIL, NOW WORSE - HURTS TO BREATHE.  CALLED DR GREWAL- COME IN.

## 2020-04-02 NOTE — Progress Notes (Signed)
Pt is a G3P1 at  334/[redacted] weeks gestation presenting with c/o a cough for 2 weeks and back pain. Pt says she had a negative covid test a week ago. She says that she had a "ccoughing fit" last night and felt a pop in her back on the right side. Pt says she has used heat on the area and has taken tylenol, but hasn't gotten relief. She denies abd pain, leaking fluid, vaginal bleeding. Says the only complication with this pregnancy and her previous pregnancy is gestational diabetes. Pt says she takes glyburide twice a day.Pt is a previous C/S for LGA.

## 2020-04-02 NOTE — ED Notes (Signed)
EDP at bedside  

## 2020-04-02 NOTE — ED Triage Notes (Signed)
Pt. Stated, back pain with cough started a week and half ago. Back pain is worse.

## 2020-04-02 NOTE — MAU Provider Note (Signed)
History     CSN: [id number]  Arrival date and time: 04/02/20 1956   First Provider Initiated Contact with Patient 04/02/20 2022      No chief complaint on file.  [name] is a 39 y.o. G3P1011 at [redacted]w[redacted]d who presents to MAU for back pain. Pt reports she has had a cold for the past 2 weeks with a bad cough. She was tested for COVID and it was negative. Patient denies SOB, but states when she takes a deep breath her back hurts. Last night she had a bad coughing spell and she felt a pop over the right rib. The pain is sharp and severe and has worsened since she went to the ED this morning despite her taking the Flexeril as prescribed. She denies abdominal pain, urinary symptoms. She denies fevers and feels like her URI symptoms are improving. Patient denies any pregnancy related complaints such as vaginal bleeding, abdominal pain or DFM.  Pt denies VB, LOF, ctx, decreased FM, vaginal discharge/odor/itching. Pt denies N/V, abdominal pain, constipation, diarrhea, or urinary problems. Pt denies fever, chills, fatigue, sweating or changes in appetite. Pt denies SOB or chest pain. Pt denies dizziness, HA, light-headedness, weakness.  Prenatal care provider? [facility]   OB History    Gravida  3   Para  1   Term  1   Preterm  0   AB  1   Living  1     SAB  1   TAB  0   Ectopic  0   Multiple  0   Live Births  1           Past Medical History:  Diagnosis Date  . Anxiety   . Depression    situational  . History of PCOS    with hirtsutism  . Impaired glucose tolerance   . Morbid obesity (HCC)   . Pregnant     Past Surgical History:  Procedure Laterality Date  . CESAREAN SECTION N/A 09/01/2017   Procedure: CESAREAN SECTION;  Surgeon: Harold Hedge, MD;  Location: [facility] BIRTHING SUITES;  Service: Obstetrics;  Laterality: N/A;  Primary edc 12/16 allergy to augmentin need RNFA    Family History  Problem Relation Age of Onset  . Diabetes  Father   . Hypertension Father   . Heart disease Father   . Ovarian cancer Maternal Grandmother 40  . Cancer Maternal Grandmother        bile duct  . Heart disease Maternal Grandfather   . Hypertension Maternal Grandfather   . Diabetes Paternal Grandmother   . Diabetes Paternal Grandfather     Social History   Tobacco Use  . Smoking status: Never Smoker  . Smokeless tobacco: Never Used  Vaping Use  . Vaping Use: Never used  Substance Use Topics  . Alcohol use: Not Currently    Alcohol/week: 0.0 - 1.0 standard drinks    Comment: social, none with pregnancy  . Drug use: No    Allergies:  Allergies  Allergen Reactions  . Augmentin [Amoxicillin-Pot Clavulanate]     Abdominal pain Has patient had a PCN reaction causing immediate rash, facial/tongue/throat swelling, SOB or lightheadedness with hypotension: No Has patient had a PCN reaction causing severe rash involving mucus membranes or skin necrosis: No Has patient had a PCN reaction that required hospitalization: No Has patient had a PCN reaction occurring within the last 10 years: No If all of the above answers are "NO", then may proceed with Cephalosporin  use.     Medications Prior to Admission  Medication Sig Dispense Refill Last Dose  . acetaminophen (TYLENOL) 325 MG tablet Take 2 tablets (650 mg total) by mouth every 6 (six) hours as needed for mild pain (for pain scale < 4). 90 tablet 1 04/02/2020 at Unknown time  . cyclobenzaprine (FLEXERIL) 10 MG tablet Take 1 tablet (10 mg total) by mouth 3 (three) times daily as needed for muscle spasms. 21 tablet 0 04/02/2020 at Unknown time  . glyBURIDE (DIABETA) 2.5 MG tablet Take 2.5 mg by mouth daily.   04/01/2020 at Unknown time  . guaiFENesin (MUCINEX) 600 MG 12 hr tablet Take 600 mg by mouth 2 (two) times daily as needed for cough or to loosen phlegm.   04/02/2020 at Unknown time  . metFORMIN (GLUCOPHAGE) 500 MG tablet Take 500 mg by mouth at bedtime.   04/01/2020 at Unknown time  .  Prenatal Vit-Fe Fum-FA-Omega (ONE-A-DAY WOMENS PRENATAL) 28-0.8 & 440 MG MISC Take 1 tablet by mouth every evening.    04/01/2020 at Unknown time  . sertraline (ZOLOFT) 100 MG tablet Take 100 mg by mouth every evening.    04/01/2020 at Unknown time  . albuterol (VENTOLIN HFA) 108 (90 Base) MCG/ACT inhaler Inhale 2 puffs into the lungs every 4 (four) hours as needed for wheezing or shortness of breath. 8 g 0   . cetirizine (ZYRTEC ALLERGY) 10 MG tablet Take 1 tablet (10 mg total) by mouth daily. 30 tablet 0   . clindamycin (CLEOCIN) 300 MG capsule Take 1 capsule (300 mg total) by mouth in the morning and at bedtime. (Patient not taking: Reported on 02/15/2020) 20 capsule 0   . fluticasone (FLONASE) 50 MCG/ACT nasal spray Place 2 sprays into both nostrils daily. (Patient taking differently: Place 2 sprays into both nostrils daily as needed for allergies. ) 16 g 6   . lidocaine (LIDODERM) 5 % Place 1 patch onto the skin daily. Remove & Discard patch within 12 hours or as directed by MD 15 patch 0     Review of Systems  Constitutional: Negative for chills, diaphoresis, fatigue and fever.  Eyes: Negative for visual disturbance.  Respiratory: Negative for shortness of breath.   Cardiovascular: Negative for chest pain.  Gastrointestinal: Negative for abdominal pain, constipation, diarrhea, nausea and vomiting.  Genitourinary: Negative for dysuria, flank pain, frequency, pelvic pain, urgency, vaginal bleeding and vaginal discharge.  Musculoskeletal: Positive for back pain (right flank).  Neurological: Negative for dizziness, weakness, light-headedness and headaches.   Physical Exam   Blood pressure 120/74, pulse 94, temperature 98.3 F (36.8 C), resp. rate 20, height  (1.803 m), weight (!) 156.9 kg, last menstrual period 08/11/2019, SpO2 97 %.  Patient Vitals for the past 24 hrs:  BP Temp Pulse Resp SpO2 Height Weight  04/02/20 2105 120/74 -- 94 -- 97 % -- --  04/02/20 2006 121/72 98.3 F (36.8  C) (!) 112 20 99 %  (1.803 m) (!) 156.9 kg   Physical Exam Vitals and nursing note reviewed.  Constitutional:      General: She is not in acute distress.    Appearance: Normal appearance. She is well-developed. She is not ill-appearing, toxic-appearing or diaphoretic.  HENT:     Head: Normocephalic and atraumatic.  Pulmonary:     Effort: Pulmonary effort is normal.     Breath sounds: Normal breath sounds.  Abdominal:     General: There is no distension.     Palpations: Abdomen is soft.  There is no mass.     Tenderness: There is no abdominal tenderness. There is no right CVA tenderness, left CVA tenderness, guarding or rebound.     Comments: Patient reports tenderness on palpation over right flank from lower ribs to iliac crest. Patient able to tolerate deep palpation without movement. No skin abnormalities, bruising or other abnormality noted.  Genitourinary:    Vagina: No vaginal discharge.  Skin:    General: Skin is warm and dry.  Neurological:     Mental Status: She is alert and oriented to person, place, and time.  Psychiatric:        Mood and Affect: Mood normal.        Behavior: Behavior normal.        Thought Content: Thought content normal.        Judgment: Judgment normal.    Results for orders placed or performed during the hospital encounter of 04/02/20 (from the past 24 hour(s))  Urinalysis, Routine w reflex microscopic     Status: Abnormal   Collection Time: 04/02/20  9:11 PM  Result Value Ref Range   Color, Urine YELLOW YELLOW   APPearance HAZY (A) CLEAR   Specific Gravity, Urine 1.033 (H) 1.005 - 1.030   pH 5.0 5.0 - 8.0   Glucose, UA NEGATIVE NEGATIVE mg/dL   Hgb urine dipstick NEGATIVE NEGATIVE   Bilirubin Urine SMALL (A) NEGATIVE   Ketones, ur NEGATIVE NEGATIVE mg/dL   Protein, ur 30 (A) NEGATIVE mg/dL   Nitrite NEGATIVE NEGATIVE   Leukocytes,Ua NEGATIVE NEGATIVE   RBC / HPF 0-5 0 - 5 RBC/hpf   WBC, UA 0-5 0 - 5 WBC/hpf   Bacteria, UA RARE (A)  NONE SEEN   Squamous Epithelial / LPF 0-5 0 - 5   Mucus PRESENT    Ca Oxalate Crys, UA PRESENT     US RENAL  Result Date: 04/02/2020 CLINICAL DATA:  Initial evaluation for acute right flank pain. Currently pregnant. EXAM: RENAL / URINARY TRACT ULTRASOUND COMPLETE COMPARISON:  None available. FINDINGS: Right Kidney: Renal measurements: 11.4 x 5.3 x 5.9 cm = volume: 186 mL . Renal echogenicity within normal limits. No shadowing echogenic calculi identified. Mild asymmetric fullness of the right renal collecting system without overt hydronephrosis. No focal renal mass. Left Kidney: Renal measurements: 12.7 x 5.5 x 6.0 cm = volume: 218 mL. Renal echogenicity within normal limits. No nephrolithiasis or hydronephrosis. No focal renal mass. Bladder: Bladder largely decompressed and not well assessed. Other: Gravid uterus partially visualized. IMPRESSION: 1. Mild asymmetric fullness of the right renal collecting system without overt hydronephrosis, likely related to current pregnancy status. 2. Otherwise normal renal ultrasound. No sonographic evidence for nephrolithiasis. Electronically Signed   By: Rise Mu M.D.   On: 04/02/2020 21:32   [place] MFM OB FOLLOW UP  Result Date: 03/14/2020 ----------------------------------------------------------------------  OBSTETRICS REPORT                       (Signed Final 03/14/2020 10:45 am) ---------------------------------------------------------------------- Patient Info  ID #:       [id number]                          D.O.B.:  1980/10/04 (38 yrs)  Name:       Tina Fuel                  Visit Date: 03/14/2020 07:52 am  Diaz ---------------------------------------------------------------------- Performed By  Attending:        Ma Rings MD         Ref. Address:     [facility]                                                             [address]                                                              [phone]                                                             [place], [place]                                                             [address]  Performed By:     Eden Lathe BS      Location:         [facility]                                 [facility]  Referred By:      Marcelle Overlie MD ---------------------------------------------------------------------- Orders  #  Description                           Code        Ordered By  1  [place] MFM OB FOLLOW UP                   09323.55    Lin Landsman ----------------------------------------------------------------------  #  Order #  Accession #                Episode #  1  [id number]                   [id number]                 [id number] ---------------------------------------------------------------------- Indications  Advanced maternal age multigravida 85+,        O70.523  third trimester  Hypertension - Chronic/Pre-existing            O10.019  [redacted] weeks gestation of pregnancy                Z3A.30  Encounter for other antenatal screening        Z36.2  follow-up  Maternal morbid obesity                        O99.210 E66.01  Previous cesarean delivery, antepartum         O34.219  Poor obstetric history: Previous gestational   O09.299  diabetes  History of PCOS  Poor obstetric history: Prior fetal            O09.299  macrosomia, antepartum (8lbs.14 oz at 38  weeks) ---------------------------------------------------------------------- Fetal Evaluation  Num Of Fetuses:         1  Fetal Heart Rate(bpm):  173  Cardiac Activity:       Observed  Presentation:           Cephalic  Placenta:               Posterior  P. Cord Insertion:      Visualized  Amniotic Fluid  AFI FV:      Within normal limits  AFI Sum(cm)     %Tile       Largest Pocket(cm)  10.2            16          4.2  RUQ(cm)       RLQ(cm)        LUQ(cm)        LLQ(cm)  4.2           0.9           2              3.1 ---------------------------------------------------------------------- Biometry  BPD:        82  mm     G. Age:  33w 0d         92  %    CI:        75.53   %    70 - 86                                                          FL/HC:      20.6   %    19.3 - 21.3  HC:      299.2  mm     G. Age:  33w 1d         77  %    HC/AC:      0.98        0.96 - 1.17  AC:      304.8  mm     G. Age:  34w 3d       > 99  %    FL/BPD:     75.1   %    71 - 87  FL:       61.6  mm     G. Age:  32w 0d         67  %    FL/AC:      20.2   %    20 - 24  HUM:      52.5  mm     G. Age:  30w 4d         48  %  Est. FW:    2203  gm    4 lb 14 oz      99  % ---------------------------------------------------------------------- OB History  Gravidity:    3         Term:   1        Prem:   0        SAB:   1  TOP:          0       Ectopic:  0        Living: 1 ---------------------------------------------------------------------- Gestational Age  LMP:           30w 6d        Date:  08/11/19                 EDD:   05/17/20  U/S Today:     33w 1d                                        EDD:   05/01/20  Best:          30w 6d     Det. By:  LMP  (08/11/19)          EDD:   05/17/20 ---------------------------------------------------------------------- Anatomy  Cranium:               Appears normal         LVOT:                   Appears normal  Cavum:                 Appears normal         Aortic Arch:            Not well visualized  Ventricles:            Appears normal         Ductal Arch:            Appears normal  Choroid Plexus:        Visualized             Diaphragm:              Appears normal  Cerebellum:            Appears normal         Stomach:                Appears normal, left  sided  Posterior Fossa:       Previously seen        Abdomen:                Appears normal  Nuchal Fold:           Not applicable  (>20    Abdominal Wall:         Appears nml (cord                         wks GA)                                        insert, abd wall)  Face:                  Appears normal         Cord Vessels:           Previously seen                         (orbits and profile)  Lips:                  Appears normal         Kidneys:                Appear normal  Palate:                Not well visualized    Bladder:                Appears normal  Thoracic:              Appears normal         Spine:                  Limited views                                                                        previously seen  Heart:                 Appears normal         Upper Extremities:      Previously seen                         (4CH, axis, and                         situs)  RVOT:                  Appears normal         Lower Extremities:      Previously seen  Other:  Rt heel previously visualized. Technically difficult due to maternal          habitus and fetal position. ---------------------------------------------------------------------- Cervix Uterus Adnexa  Cervix  Normal appearance by transabdominal scan.  Uterus  No abnormality visualized.  Right Ovary  Not visualized.  Left Ovary  Not visualized.  Cul De Sac  No free fluid seen.  Adnexa  No abnormality visualized. ---------------------------------------------------------------------- Comments  This patient was seen for a follow up growth scan due to  advanced maternal age and a large for gestational age fetus  that was noted during her prior ultrasound exam.  The views  of the fetal anatomy were unable to be fully visualized during  her prior ultrasound exams.  The patient reports that she  recently failed her 1 hour glucose challenge test.  She is  scheduled for a 3-hour glucose tolerance test soon.  She was informed that the fetal growth measures at the 99th  percentile for her gestational age.  There was normal  amniotic fluid noted today.  The views of the fetal  anatomy continue to remain limited due  to the fetal position and maternal body habitus.  However,  there were no obvious anomalies noted today.  She was  advised to have her baby examined after delivery.  Due to the larger fetal size noted today, the patient should  continue to have growth ultrasounds performed in your office.  Weekly fetal testing should be started should she be  diagnosed with gestational diabetes requiring medication  treatment.  No further exams were scheduled in our office. ----------------------------------------------------------------------                   Ma RingsVictor Fang, MD Electronically Signed Final Report   03/14/2020 10:45 am ----------------------------------------------------------------------   MAU Course  Procedures  MDM -worsening right flank pain after visit to ED this morning and starting Flexeril -pt advised this is a non-pregnancy related problem, but can perform renal US prior to sending to ED, if preferred, pt accepts. Patient advised a renal US will not constitute a full work-up of her concern and patient will need to be transferred to the ED for additional evaluation, pt agrees with plan. -FHT: 160 -UA: hazy/SG 1.033/sm bilirubin/30PRO/rare bacteria, urine culture sent -Renal US: 1. Mild asymmetric fullness of the right renal collecting system without overt hydronephrosis, likely related to current pregnancy status. 2. Otherwise normal renal ultrasound. No sonographic evidence for nephrolithiasis. -called and spoke with ED Provider Dr. Jacqulyn BathLong who accepts transfer -transfer to ED for further work-up  Orders Placed This Encounter  Procedures  . Culture, OB Urine    Standing Status:   Standing    Number of Occurrences:   1  . US RENAL    Standing Status:   Standing    Number of Occurrences:   1    Order Specific Question:   Symptom/Reason for Exam    Answer:   Right flank pain [[id number]]  . Urinalysis, Routine w reflex microscopic    Standing Status:    Standing    Number of Occurrences:   1    Assessment and Plan   1. Right flank pain   2. [redacted] weeks gestation of pregnancy    -transfer to ED for further evaluation -return MAU precautions given  Odie Seraicole E Deniro Laymon 04/02/2020, 10:07 PM

## 2020-04-02 NOTE — ED Notes (Signed)
Fetal heart tones heard between 164-170. Rapid OB notified.

## 2020-04-02 NOTE — ED Triage Notes (Signed)
Pt arrives from MAU with c/o right flank pain. Pt is approx [redacted] weeks pregnant. She was seen for the same earlier today, given flexeril and lidocaine patch, says that the pain is not any better. UA completed at MAU. Pt says she has been sick with a cough, t max 99. No urinary pain.

## 2020-04-02 NOTE — Discharge Instructions (Signed)
Take Tylenol as needed for mild-moderate pain Take Flexeril 10mg  up to three times daily for muscle pain You can use Lidocaine cream or a lidocaine patch which are over the counter to rub on the painful area Apply heat or ice to the area  Please follow up with your OBGYN

## 2020-04-03 ENCOUNTER — Emergency Department (HOSPITAL_COMMUNITY): Payer: BC Managed Care – PPO

## 2020-04-03 ENCOUNTER — Encounter (HOSPITAL_COMMUNITY): Payer: Self-pay | Admitting: Obstetrics and Gynecology

## 2020-04-03 LAB — CBC WITH DIFFERENTIAL/PLATELET
Abs Immature Granulocytes: 0.2 10*3/uL — ABNORMAL HIGH (ref 0.00–0.07)
Basophils Absolute: 0 10*3/uL (ref 0.0–0.1)
Basophils Relative: 0 %
Eosinophils Absolute: 0.1 10*3/uL (ref 0.0–0.5)
Eosinophils Relative: 1 %
HCT: 35.6 % — ABNORMAL LOW (ref 36.0–46.0)
Hemoglobin: 11.4 g/dL — ABNORMAL LOW (ref 12.0–15.0)
Immature Granulocytes: 1 %
Lymphocytes Relative: 26 %
Lymphs Abs: 3.7 10*3/uL (ref 0.7–4.0)
MCH: 29.1 pg (ref 26.0–34.0)
MCHC: 32 g/dL (ref 30.0–36.0)
MCV: 90.8 fL (ref 80.0–100.0)
Monocytes Absolute: 0.8 10*3/uL (ref 0.1–1.0)
Monocytes Relative: 5 %
Neutro Abs: 9.6 10*3/uL — ABNORMAL HIGH (ref 1.7–7.7)
Neutrophils Relative %: 67 %
Platelets: 301 10*3/uL (ref 150–400)
RBC: 3.92 MIL/uL (ref 3.87–5.11)
RDW: 14.9 % (ref 11.5–15.5)
WBC: 14.4 10*3/uL — ABNORMAL HIGH (ref 4.0–10.5)
nRBC: 0 % (ref 0.0–0.2)

## 2020-04-03 LAB — I-STAT CHEM 8, ED
BUN: 8 mg/dL (ref 6–20)
Calcium, Ion: 1.16 mmol/L (ref 1.15–1.40)
Chloride: 103 mmol/L (ref 98–111)
Creatinine, Ser: 0.4 mg/dL — ABNORMAL LOW (ref 0.44–1.00)
Glucose, Bld: 142 mg/dL — ABNORMAL HIGH (ref 70–99)
HCT: 34 % — ABNORMAL LOW (ref 36.0–46.0)
Hemoglobin: 11.6 g/dL — ABNORMAL LOW (ref 12.0–15.0)
Potassium: 3.8 mmol/L (ref 3.5–5.1)
Sodium: 135 mmol/L (ref 135–145)
TCO2: 23 mmol/L (ref 22–32)

## 2020-04-03 LAB — HEPATIC FUNCTION PANEL
ALT: 23 U/L (ref 0–44)
AST: 19 U/L (ref 15–41)
Albumin: 2.6 g/dL — ABNORMAL LOW (ref 3.5–5.0)
Alkaline Phosphatase: 105 U/L (ref 38–126)
Bilirubin, Direct: <0.1 mg/dL (ref 0.0–0.2)
Total Bilirubin: 0.8 mg/dL (ref 0.3–1.2)
Total Protein: 6.2 g/dL — ABNORMAL LOW (ref 6.5–8.1)

## 2020-04-03 LAB — SARS CORONAVIRUS 2 BY RT PCR (HOSPITAL ORDER, PERFORMED IN ~~LOC~~ HOSPITAL LAB): SARS Coronavirus 2: NEGATIVE

## 2020-04-03 MED ORDER — SODIUM CHLORIDE 0.9 % IV SOLN
1.0000 g | Freq: Once | INTRAVENOUS | Status: AC
  Administered 2020-04-03: 1 g via INTRAVENOUS
  Filled 2020-04-03: qty 10

## 2020-04-03 MED ORDER — CEFDINIR 300 MG PO CAPS
300.0000 mg | ORAL_CAPSULE | Freq: Two times a day (BID) | ORAL | 0 refills | Status: DC
Start: 2020-04-03 — End: 2020-04-24

## 2020-04-03 MED ORDER — ALBUTEROL SULFATE HFA 108 (90 BASE) MCG/ACT IN AERS
2.0000 | INHALATION_SPRAY | Freq: Once | RESPIRATORY_TRACT | Status: AC
  Administered 2020-04-03: 2 via RESPIRATORY_TRACT
  Filled 2020-04-03: qty 6.7

## 2020-04-03 MED ORDER — IOHEXOL 350 MG/ML SOLN
70.0000 mL | Freq: Once | INTRAVENOUS | Status: AC | PRN
  Administered 2020-04-03: 70 mL via INTRAVENOUS

## 2020-04-03 NOTE — ED Notes (Signed)
Discharge instructions including prescription and follow up with OBGYN discussed with pt. Pt verbalized understanding with no questions at this time. Pt to go home with spouse.

## 2020-04-03 NOTE — ED Notes (Signed)
Pt transported to CT ?

## 2020-04-03 NOTE — ED Provider Notes (Signed)
[facility] EMERGENCY DEPARTMENT Provider Note   CSN: [id number] Arrival date & time: 04/02/20  1956     History Chief Complaint  Patient presents with  . Flank Pain    Tina Diaz is a 39 y.o. female.  The history is provided by the patient.  Flank Pain This is a new problem. The current episode started more than 1 week ago. The problem occurs constantly. The problem has been gradually worsening. Pertinent negatives include no abdominal pain, no headaches and no shortness of breath. Associated symptoms comments: Pain under the right ribs and heard a pop with coughing today. . Nothing aggravates the symptoms. Nothing relieves the symptoms. Treatments tried: flexeril lidoderm and tylenol  The treatment provided no relief.  Patient is [redacted] weeks pregnant and was seen earlier in the day here for rib pain and feeling of a pop post coughing. Then went to the MAU and was told it wasn't OB and to present to the ED.  Patient wants to know what is causing the pain over her ribs and posterior right iliac crest.  No f/c/r.  Dry cough for going on 2 weeks.  Negative covid swab a week ago per reports.  No leg pain but ongoing tachycardia and did have proteinuria today.       Past Medical History:  Diagnosis Date  . Anxiety   . Depression    situational  . History of PCOS    with hirtsutism  . Impaired glucose tolerance   . Morbid obesity (HCC)   . Pregnant     Patient Active Problem List   Diagnosis Date Noted  . 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 Impaired glucose tolerance test 07/06/2017    Past Surgical History:  Procedure Laterality Date  . CESAREAN SECTION N/A 09/01/2017   Procedure: CESAREAN SECTION;  Surgeon: Harold Hedge, MD;  Location: [facility] BIRTHING SUITES;  Service: Obstetrics;  Laterality: N/A;  Primary edc 12/16 allergy to augmentin need RNFA     OB History    Gravida  3   Para  1   Term  1   Preterm  0   AB  1   Living  1     SAB    1   TAB  0   Ectopic  0   Multiple  0   Live Births  1           Family History  Problem Relation Age of Onset  . Diabetes Father   . Hypertension Father   . Heart disease Father   . Ovarian cancer Maternal Grandmother 40  . Cancer Maternal Grandmother        bile duct  . Heart disease Maternal Grandfather   . Hypertension Maternal Grandfather   . Diabetes Paternal Grandmother   . Diabetes Paternal Grandfather     Social History   Tobacco Use  . Smoking status: Never Smoker  . Smokeless tobacco: Never Used  Vaping Use  . Vaping Use: Never used  Substance Use Topics  . Alcohol use: Not Currently    Alcohol/week: 0.0 - 1.0 standard drinks    Comment: social, none with pregnancy  . Drug use: No    Home Medications Prior to Admission medications   Medication Sig Start Date End Date Taking? Authorizing Provider  acetaminophen (TYLENOL) 325 MG tablet Take 2 tablets (650 mg total) by mouth every 6 (six) hours as needed for mild pain (for pain scale < 4). 09/04/17  Yes Tomblin,  Fayrene Fearing, MD  cyclobenzaprine (FLEXERIL) 10 MG tablet Take 1 tablet (10 mg total) by mouth 3 (three) times daily as needed for muscle spasms. 04/02/20  Yes Bethel Born, PA-C  glyBURIDE (DIABETA) 2.5 MG tablet Take 2.5 mg by mouth daily. 03/29/20  Yes [provider]  guaiFENesin (MUCINEX) 600 MG 12 hr tablet Take 600 mg by mouth 2 (two) times daily as needed for cough or to loosen phlegm.   Yes [provider]  metFORMIN (GLUCOPHAGE) 500 MG tablet Take 500 mg by mouth at bedtime. 01/02/20  Yes [provider]  Prenatal Vit-Fe Fum-FA-Omega (ONE-A-DAY WOMENS PRENATAL) 28-0.8 & 440 MG MISC Take 1 tablet by mouth every evening.    Yes [provider]  sertraline (ZOLOFT) 100 MG tablet Take 100 mg by mouth every evening.  05/02/16  Yes [provider]  albuterol (VENTOLIN HFA) 108 (90 Base) MCG/ACT inhaler Inhale 2 puffs into the lungs every 4 (four) hours as  needed for wheezing or shortness of breath. 01/12/20   Roxy Horseman, PA-C  cetirizine (ZYRTEC ALLERGY) 10 MG tablet Take 1 tablet (10 mg total) by mouth daily. 01/12/20   Roxy Horseman, PA-C  clindamycin (CLEOCIN) 300 MG capsule Take 1 capsule (300 mg total) by mouth in the morning and at bedtime. Patient not taking: Reported on 02/15/2020 01/22/20   Junie Spencer, FNP  fluticasone [facility]) 50 MCG/ACT nasal spray Place 2 sprays into both nostrils daily. Patient taking differently: Place 2 sprays into both nostrils daily as needed for allergies.  01/22/20   Jannifer Rodney A, FNP  lidocaine (LIDODERM) 5 % Place 1 patch onto the skin daily. Remove & Discard patch within 12 hours or as directed by MD 04/02/20   Bethel Born, PA-C    Allergies    Augmentin [amoxicillin-pot clavulanate]  Review of Systems   Review of Systems  Constitutional: Negative for unexpected weight change.  HENT: Negative for congestion.   Eyes: Negative for visual disturbance.  Respiratory: Positive for cough. Negative for shortness of breath.   Cardiovascular:       Rib pain   Gastrointestinal: Negative for abdominal pain.  Genitourinary: Positive for flank pain.  Musculoskeletal: Positive for back pain.  Neurological: Negative for headaches.  Psychiatric/Behavioral: Negative for agitation.  All other systems reviewed and are negative.   Physical Exam Updated Vital Signs BP 111/65   Pulse (!) 102   Temp 98.4 F (36.9 C) (Oral)   Resp 17   Ht 5\' 11"  (1.803 m)   Wt (!) 156.9 kg   LMP 08/11/2019   SpO2 97%   BMI 48.23 kg/m   Physical Exam Vitals and nursing note reviewed.  Constitutional:      General: She is not in acute distress.    Appearance: Normal appearance.  HENT:     Head: Normocephalic and atraumatic.     Nose: Nose normal.  Eyes:     Conjunctiva/sclera: Conjunctivae normal.     Pupils: Pupils are equal, round, and reactive to light.  Cardiovascular:     Rate and Rhythm:  Regular rhythm. Tachycardia present.     Pulses: Normal pulses.     Heart sounds: Normal heart sounds.  Pulmonary:     Effort: Pulmonary effort is normal.     Breath sounds: Normal breath sounds.  Abdominal:     General: Bowel sounds are normal.     Tenderness: There is no abdominal tenderness. There is no guarding.     Comments: gravid  Musculoskeletal:        General: No tenderness.     Cervical back: Normal range of motion and neck supple.     Right lower leg: No edema.     Left lower leg: No edema.  Skin:    General: Skin is warm and dry.     Capillary Refill: Capillary refill takes less than 2 seconds.  Neurological:     General: No focal deficit present.     Mental Status: She is alert and oriented to person, place, and time.     Deep Tendon Reflexes: Reflexes normal.  Psychiatric:        Mood and Affect: Mood normal.        Behavior: Behavior normal.     ED Results / Procedures / Treatments   Labs (all labs ordered are listed, but only abnormal results are displayed) Results for orders placed or performed during the hospital encounter of 04/02/20  SARS Coronavirus 2 by RT PCR (hospital order, performed in [facility] hospital lab) Nasopharyngeal Nasopharyngeal Swab   Specimen: Nasopharyngeal Swab  Result Value Ref Range   SARS Coronavirus 2 NEGATIVE NEGATIVE  Urinalysis, Routine w reflex microscopic  Result Value Ref Range   Color, Urine YELLOW YELLOW   APPearance HAZY (A) CLEAR   Specific Gravity, Urine 1.033 (H) 1.005 - 1.030   pH 5.0 5.0 - 8.0   Glucose, UA NEGATIVE NEGATIVE mg/dL   Hgb urine dipstick NEGATIVE NEGATIVE   Bilirubin Urine SMALL (A) NEGATIVE   Ketones, ur NEGATIVE NEGATIVE mg/dL   Protein, ur 30 (A) NEGATIVE mg/dL   Nitrite NEGATIVE NEGATIVE   Leukocytes,Ua NEGATIVE NEGATIVE   RBC / HPF 0-5 0 - 5 RBC/hpf   WBC, UA 0-5 0 - 5 WBC/hpf   Bacteria, UA RARE (A) NONE SEEN   Squamous Epithelial / LPF 0-5 0 - 5   Mucus PRESENT    Ca Oxalate Crys,  UA PRESENT   CBC with Differential/Platelet  Result Value Ref Range   WBC 14.4 (H) 4.0 - 10.5 K/uL   RBC 3.92 3.87 - 5.11 MIL/uL   Hemoglobin 11.4 (L) 12.0 - 15.0 g/dL   HCT 16.1 (L) 36 - 46 %   MCV 90.8 80.0 - 100.0 fL   MCH 29.1 26.0 - 34.0 pg   MCHC 32.0 30.0 - 36.0 g/dL   RDW 09.6 04.5 - 40.9 %   Platelets 301 150 - 400 K/uL   nRBC 0.0 0.0 - 0.2 %   Neutrophils Relative % 67 %   Neutro Abs 9.6 (H) 1.7 - 7.7 K/uL   Lymphocytes Relative 26 %   Lymphs Abs 3.7 0.7 - 4.0 K/uL   Monocytes Relative 5 %   Monocytes Absolute 0.8 0 - 1 K/uL   Eosinophils Relative 1 %   Eosinophils Absolute 0.1 0 - 0 K/uL   Basophils Relative 0 %   Basophils Absolute 0.0 0 - 0 K/uL   Immature Granulocytes 1 %   Abs Immature Granulocytes 0.20 (H) 0.00 - 0.07 K/uL  Hepatic function panel  Result Value Ref Range   Total Protein 6.2 (L) 6.5 - 8.1 g/dL   Albumin 2.6 (L) 3.5 - 5.0 g/dL   AST 19 15 - 41 U/L   ALT 23 0 - 44 U/L   Alkaline Phosphatase 105 38 - 126 U/L   Total Bilirubin 0.8 0.3 - 1.2 mg/dL   Bilirubin, Direct <8.1 0.0 - 0.2 mg/dL   Indirect Bilirubin NOT CALCULATED 0.3 - 0.9  mg/dL  I-stat chem 8, ED (not at [facility] or [facility])  Result Value Ref Range   Sodium 135 135 - 145 mmol/L   Potassium 3.8 3.5 - 5.1 mmol/L   Chloride 103 98 - 111 mmol/L   BUN 8 6 - 20 mg/dL   Creatinine, Ser 1.61 (L) 0.44 - 1.00 mg/dL   Glucose, Bld 096 (H) 70 - 99 mg/dL   Calcium, Ion 0.45 4.09 - 1.40 mmol/L   TCO2 23 22 - 32 mmol/L   Hemoglobin 11.6 (L) 12.0 - 15.0 g/dL   HCT 81.1 (L) 36 - 46 %   DG Chest 2 View  Result Date: 04/03/2020 CLINICAL DATA:  Cough for 1 week EXAM: CHEST - 2 VIEW COMPARISON:  09/21/2005 FINDINGS: The heart size and mediastinal contours are within normal limits. Both lungs are clear. The visualized skeletal structures are unremarkable. IMPRESSION: No active cardiopulmonary disease. Electronically Signed   By: Alcide Clever M.D.   On: 04/03/2020 02:56   US RENAL  Result Date:  04/02/2020 CLINICAL DATA:  Initial evaluation for acute right flank pain. Currently pregnant. EXAM: RENAL / URINARY TRACT ULTRASOUND COMPLETE COMPARISON:  None available. FINDINGS: Right Kidney: Renal measurements: 11.4 x 5.3 x 5.9 cm = volume: 186 mL . Renal echogenicity within normal limits. No shadowing echogenic calculi identified. Mild asymmetric fullness of the right renal collecting system without overt hydronephrosis. No focal renal mass. Left Kidney: Renal measurements: 12.7 x 5.5 x 6.0 cm = volume: 218 mL. Renal echogenicity within normal limits. No nephrolithiasis or hydronephrosis. No focal renal mass. Bladder: Bladder largely decompressed and not well assessed. Other: Gravid uterus partially visualized. IMPRESSION: 1. Mild asymmetric fullness of the right renal collecting system without overt hydronephrosis, likely related to current pregnancy status. 2. Otherwise normal renal ultrasound. No sonographic evidence for nephrolithiasis. Electronically Signed   By: Rise Mu M.D.   On: 04/02/2020 21:32   [place] MFM OB FOLLOW UP  Result Date: 03/14/2020 ----------------------------------------------------------------------  OBSTETRICS REPORT                       (Signed Final 03/14/2020 10:45 am) ---------------------------------------------------------------------- Patient Info  ID #:       [id number]                          D.O.B.:  1981-04-20 (38 yrs)  Name:       Jefferson Fuel                  Visit Date: 03/14/2020 07:52 am              [place] ---------------------------------------------------------------------- Performed By  Attending:        Ma Rings MD         Ref. Address:     [facility]  [address]                                                             [address]                                                             [place], [place]                                                              [id number]  Performed By:     Eden Lathe BS      Location[facility]                    RDMS RVT                                 [facility]  Referred By:      Marcelle Overlie MD ---------------------------------------------------------------------- Orders  #  Description                           Code        Ordered By  1  [place] MFM OB FOLLOW UP                   60454.09    Lin Landsman ----------------------------------------------------------------------  #  Order #                     Accession #                Episode #  1  [id number]                   [id number]                 [id number] ---------------------------------------------------------------------- Indications  Advanced maternal age multigravida 47+,        O59.523  third trimester  Hypertension - Chronic/Pre-existing            O10.019  [redacted] weeks gestation of pregnancy                Z3A.30  Encounter for other antenatal screening        Z36.2  follow-up  Maternal morbid obesity  O99.210 E66.01  Previous cesarean delivery, antepartum         O34.219  Poor obstetric history: Previous gestational   O09.299  diabetes  History of PCOS  Poor obstetric history: Prior fetal            O09.299  macrosomia, antepartum (8lbs.14 oz at 38  weeks) ---------------------------------------------------------------------- Fetal Evaluation  Num Of Fetuses:         1  Fetal Heart Rate(bpm):  173  Cardiac Activity:       Observed  Presentation:           Cephalic  Placenta:               Posterior  P. Cord Insertion:      Visualized  Amniotic Fluid  AFI FV:      Within normal limits  AFI Sum(cm)     %Tile       Largest Pocket(cm)  10.2            16          4.2  RUQ(cm)       RLQ(cm)       LUQ(cm)        LLQ(cm)  4.2           0.9           2              3.1  ---------------------------------------------------------------------- Biometry  BPD:        82  mm     G. Age:  33w 0d         92  %    CI:        75.53   %    70 - 86                                                          FL/HC:      20.6   %    19.3 - 21.3  HC:      299.2  mm     G. Age:  33w 1d         77  %    HC/AC:      0.98        0.96 - 1.17  AC:      304.8  mm     G. Age:  34w 3d       > 99  %    FL/BPD:     75.1   %    71 - 87  FL:       61.6  mm     G. Age:  32w 0d         67  %    FL/AC:      20.2   %    20 - 24  HUM:      52.5  mm     G. Age:  30w 4d         48  %  Est. FW:    2203  gm    4 lb 14 oz      99  % ---------------------------------------------------------------------- OB History  Gravidity:    3         Term:   1  Prem:   0        SAB:   1  TOP:          0       Ectopic:  0        Living: 1 ---------------------------------------------------------------------- Gestational Age  LMP:           30w 6d        Date:  08/11/19                 EDD:   05/17/20  U/S Today:     33w 1d                                        EDD:   05/01/20  Best:          30w 6d     Det. By:  LMP  (08/11/19)          EDD:   05/17/20 ---------------------------------------------------------------------- Anatomy  Cranium:               Appears normal         LVOT:                   Appears normal  Cavum:                 Appears normal         Aortic Arch:            Not well visualized  Ventricles:            Appears normal         Ductal Arch:            Appears normal  Choroid Plexus:        Visualized             Diaphragm:              Appears normal  Cerebellum:            Appears normal         Stomach:                Appears normal, left                                                                        sided  Posterior Fossa:       Previously seen        Abdomen:                Appears normal  Nuchal Fold:           Not applicable (>20    Abdominal Wall:         Appears nml (cord                          wks GA)                                        insert, abd  wall)  Face:                  Appears normal         Cord Vessels:           Previously seen                         (orbits and profile)  Lips:                  Appears normal         Kidneys:                Appear normal  Palate:                Not well visualized    Bladder:                Appears normal  Thoracic:              Appears normal         Spine:                  Limited views                                                                        previously seen  Heart:                 Appears normal         Upper Extremities:      Previously seen                         (4CH, axis, and                         situs)  RVOT:                  Appears normal         Lower Extremities:      Previously seen  Other:  Rt heel previously visualized. Technically difficult due to maternal          habitus and fetal position. ---------------------------------------------------------------------- Cervix Uterus Adnexa  Cervix  Normal appearance by transabdominal scan.  Uterus  No abnormality visualized.  Right Ovary  Not visualized.  Left Ovary  Not visualized.  Cul De Sac  No free fluid seen.  Adnexa  No abnormality visualized. ---------------------------------------------------------------------- Comments  This patient was seen for a follow up growth scan due to  advanced maternal age and a large for gestational age fetus  that was noted during her prior ultrasound exam.  The views  of the fetal anatomy were unable to be fully visualized during  her prior ultrasound exams.  The patient reports that she  recently failed her 1 hour glucose challenge test.  She is  scheduled for a 3-hour glucose tolerance test soon.  She was informed that the fetal growth measures at the 99th  percentile for her gestational age.  There was normal  amniotic fluid noted today.  The views of the fetal anatomy continue to remain limited due  to the fetal position  and maternal  body habitus.  However,  there were no obvious anomalies noted today.  She was  advised to have her baby examined after delivery.  Due to the larger fetal size noted today, the patient should  continue to have growth ultrasounds performed in your office.  Weekly fetal testing should be started should she be  diagnosed with gestational diabetes requiring medication  treatment.  No further exams were scheduled in our office. ----------------------------------------------------------------------                   Ma Rings, MD Electronically Signed Final Report   03/14/2020 10:45 am ----------------------------------------------------------------------   EKG None  Radiology DG Chest 2 View  Result Date: 04/03/2020 CLINICAL DATA:  Cough for 1 week EXAM: CHEST - 2 VIEW COMPARISON:  09/21/2005 FINDINGS: The heart size and mediastinal contours are within normal limits. Both lungs are clear. The visualized skeletal structures are unremarkable. IMPRESSION: No active cardiopulmonary disease. Electronically Signed   By: Alcide Clever M.D.   On: 04/03/2020 02:56   US RENAL  Result Date: 04/02/2020 CLINICAL DATA:  Initial evaluation for acute right flank pain. Currently pregnant. EXAM: RENAL / URINARY TRACT ULTRASOUND COMPLETE COMPARISON:  None available. FINDINGS: Right Kidney: Renal measurements: 11.4 x 5.3 x 5.9 cm = volume: 186 mL . Renal echogenicity within normal limits. No shadowing echogenic calculi identified. Mild asymmetric fullness of the right renal collecting system without overt hydronephrosis. No focal renal mass. Left Kidney: Renal measurements: 12.7 x 5.5 x 6.0 cm = volume: 218 mL. Renal echogenicity within normal limits. No nephrolithiasis or hydronephrosis. No focal renal mass. Bladder: Bladder largely decompressed and not well assessed. Other: Gravid uterus partially visualized. IMPRESSION: 1. Mild asymmetric fullness of the right renal collecting system without overt hydronephrosis, likely  related to current pregnancy status. 2. Otherwise normal renal ultrasound. No sonographic evidence for nephrolithiasis. Electronically Signed   By: Rise Mu M.D.   On: 04/02/2020 21:32    Procedures Procedures (including critical care time)  Medications Ordered in ED Medications  cefTRIAXone (ROCEPHIN) 1 g in sodium chloride 0.9 % 100 mL IVPB (has no administration in time range)  acetaminophen (TYLENOL) tablet 1,000 mg (1,000 mg Oral Given 04/03/20 0010)  albuterol (VENTOLIN HFA) 108 (90 Base) MCG/ACT inhaler 2 puff (2 puffs Inhalation Given 04/03/20 0312)  iohexol (OMNIPAQUE) 350 MG/ML injection 70 mL (70 mLs Intravenous Contrast Given 04/03/20 0438)    ED Course  I have reviewed the triage vital signs and the nursing notes.  Pertinent labs & imaging results that were available during my care of the patient were reviewed by me and considered in my medical decision making (see chart for details).   3 am case d/w Dr. Vincente Poli regarding elevated BP in the ED and proteinuria in the MAU.  Nothing additional to do at this time.  Follow up in the office.   Patient was initially reticent to undergo imaging for any reason. The patient does not have covid and lungs are clear but I cannot exclude pneumonia or PE given the HR, cough and time course.  We discussed the risks of PE.  The risks are death, hypoxia, prolonged pain.  CTA is the definitive test for this condition and given that we have not found the source and patient still has cough and pain under the breast and in the flank this is necessary given the hypercoaguable state of pregnancy.  After multiple discussions with the patient and her husband regarding the risks and  benefits of CT scan, the patient and her asked to speak to the radiologist regarding the risks of radiation.  Patient and husband spoke to Dr. Elvera MariaeHay regarding the risks of the procedure. They have signed the release of liability and wish to proceed with this test.    CTA  was performed and is negative for PE but positive for PNA.  Will treat for bacterial PNA for 7 days.  Patient will need close follow up to ensure resolution of symptoms and needs to inform her OB of this diagnosis.  Moreover, Patient must have BP checks and repeat urine done in the office to exclude early preeclampsia.  Low back pain is certainly a strain from coughing.  Patient and husband are happy with their care and that they have a diagnosis and agree to follow up today.    Tina Diaz was evaluated in Emergency Department on 04/03/2020 for the symptoms described in the history of present illness. She was evaluated in the context of the global COVID-19 pandemic, which necessitated consideration that the patient might be at risk for infection with the SARS-CoV-2 virus that causes COVID-19. Institutional protocols and algorithms that pertain to the evaluation of patients at risk for COVID-19 are in a state of rapid change based on information released by regulatory bodies including the CDC and federal and state organizations. These policies and algorithms were followed during the patient's care in the ED.   Final Clinical Impression(s) / ED Diagnoses Final diagnoses:  [redacted] weeks gestation of pregnancy   Return for intractable cough, coughing up blood,fevers >100.4 unrelieved by medication, shortness of breath, intractable vomiting, chest pain, shortness of breath, weakness,numbness, changes in speech, facial asymmetry,abdominal pain, passing out,Inability to tolerate liquids or food, cough, altered mental status or any concerns. No signs of systemic illness or infection. The patient is nontoxic-appearing on exam and vital signs are within normal limits.   I have reviewed the triage vital signs and the nursing notes. Pertinent labs &imaging results that were available during my care of the patient were reviewed by me and considered in my medical decision making (see chart for  details).After history, exam, and medical workup I feel the patient has beenappropriately medically screened and is safe for discharge home. Pertinent diagnoses were discussed with the patient. Patient was given return precautions.    Ifeanyi Mickelson, MD 04/03/20 [id number]

## 2020-04-03 NOTE — ED Notes (Signed)
Pt transported to XR.  

## 2020-04-04 LAB — CULTURE, OB URINE

## 2020-04-11 NOTE — H&P (Signed)
Tina Diaz is a 39 y.o. female presenting for scheduled cesarean section. Pregnancy c/b several issues. She has known glucose intolerance and was on metformin prior to pregnancy. This pregnancy her metformin was increased to 1000mg  at night and 500mg  in the AM. Glyburide 36mq QHS was also added. She has a hx cHTN not requiring medications. She has been on baby asa for PIH prevention and had reassuring antenatal testing overall. Serial growth showed LGA. She has a hx LGA (8#14 at 38.2 wga, Tina Diaz) in first pregnancy requiring CS. She has a hx of depression an dis on Zoloft 100mg . She is morbidly obese. She is ama and had RR panorama (female).  OB History    Gravida  3   Para  1   Term  1   Preterm  0   AB  1   Living  1     SAB  1   TAB  0   Ectopic  0   Multiple  0   Live Births  1          Past Medical History:  Diagnosis Date  . Anxiety   . Depression    situational  . History of PCOS    with hirtsutism  . Impaired glucose tolerance   . Morbid obesity (HCC)   . Pregnant    Past Surgical History:  Procedure Laterality Date  . CESAREAN SECTION N/A 09/01/2017   Procedure: CESAREAN SECTION;  Surgeon: 4m, MD;  Location: [facility] BIRTHING SUITES;  Service: Obstetrics;  Laterality: N/A;  Primary edc 12/16 allergy to augmentin need RNFA   Family History: family history includes Cancer in her maternal grandmother; Diabetes in her father, paternal grandfather, and paternal grandmother; Heart disease in her father and maternal grandfather; Hypertension in her father and maternal grandfather; Ovarian cancer (age of onset: 73) in her maternal grandmother. Social History:  reports that she has never smoked. She has never used smokeless tobacco. She reports previous alcohol use. She reports that she does not use drugs.     Maternal Diabetes: Yes:  Diabetes Type:  Pre-pregnancy Genetic Screening: Normal Maternal Ultrasounds/Referrals: Normal Fetal Ultrasounds  or other Referrals:  None Maternal Substance Abuse:  No Significant Maternal Medications:  None Significant Maternal Lab Results:  None Other Comments:  None  Review of Systems History   Last menstrual period 08/11/2019. Exam Physical Exam  (from office) NAD, A&O NWOB Abd soft, nondistended, gravid  Prenatal labs: ABO, Rh:   Antibody:   Rubella:   RPR:    HBsAg:    HIV:    GBS:     Assessment/Plan: 39 yo G3P1011 presenting for scheduled repeat cesarean section. Hx c/b above. Plan metformin 500mg  pp with BS testing and diabetic diet. Plan for likely wound vac pp to help with wound healing. Continue zoloft.  Risks discussed including infection, bleeding, damage to surrounding structures, the need for additional procedures including hysterectomy, and the possibility of uterine rupture with neonatal morbidity/mortality, scarring, and abnormal placentation with subsequent pregnancies. Patient agrees to proceed. Gent/clinda on call to OR.   41 04/11/2020, 3:20 PM

## 2020-04-17 LAB — OB RESULTS CONSOLE GBS: GBS: NEGATIVE

## 2020-04-23 ENCOUNTER — Telehealth (HOSPITAL_COMMUNITY): Payer: Self-pay | Admitting: *Deleted

## 2020-04-23 ENCOUNTER — Encounter (HOSPITAL_COMMUNITY): Payer: Self-pay

## 2020-04-23 NOTE — Telephone Encounter (Signed)
Preadmission screen  

## 2020-04-23 NOTE — Patient Instructions (Addendum)
Yena Tisby  04/23/2020   Your procedure is scheduled on:  05/03/2020  Arrive at 0530 at Entrance C on [employer] at [facility]  and [employer]. You are invited to use the FREE valet parking or use the Visitor's parking deck.  Pick up the phone at the desk and dial [phone].  Call this number if you have problems the morning of surgery: [phone]  Remember:   Do not eat food:(After Midnight) Desps de medianoche.  Do not drink clear liquids: (After Midnight) Desps de medianoche.  Take these medicines the morning of surgery with A SIP OF WATER:  Do not take metformin the night before surgery.    Bring your inhaler with you  Do not wear jewelry, make-up or nail polish.  Do not wear lotions, powders, or perfumes. Do not wear deodorant.  Do not shave 48 hours prior to surgery.  Do not bring valuables to the hospital.  [facility] is not   responsible for any belongings or valuables brought to the hospital.  Contacts, dentures or bridgework may not be worn into surgery.  Leave suitcase in the car. After surgery it may be brought to your room.  For patients admitted to the hospital, checkout time is 11:00 AM the day of              discharge.      Please read over the following fact sheets that you were given:     Preparing for Surgery

## 2020-04-24 ENCOUNTER — Telehealth (HOSPITAL_COMMUNITY): Payer: Self-pay | Admitting: *Deleted

## 2020-04-24 NOTE — Telephone Encounter (Signed)
Preadmission screen  

## 2020-04-25 ENCOUNTER — Encounter (HOSPITAL_COMMUNITY): Payer: Self-pay | Admitting: Anesthesiology

## 2020-04-25 ENCOUNTER — Encounter (HOSPITAL_COMMUNITY): Payer: Self-pay

## 2020-04-25 ENCOUNTER — Other Ambulatory Visit: Payer: Self-pay | Admitting: Obstetrics and Gynecology

## 2020-04-30 ENCOUNTER — Inpatient Hospital Stay (HOSPITAL_COMMUNITY)
Admission: AD | Admit: 2020-04-30 | Discharge: 2020-05-03 | DRG: 788 | Disposition: A | Discharge status: Home / Self Care | Payer: BC Managed Care – PPO | Attending: Obstetrics and Gynecology | Admitting: Obstetrics and Gynecology

## 2020-04-30 ENCOUNTER — Inpatient Hospital Stay (HOSPITAL_COMMUNITY): Payer: BC Managed Care – PPO | Admitting: Anesthesiology

## 2020-04-30 ENCOUNTER — Other Ambulatory Visit: Payer: Self-pay

## 2020-04-30 ENCOUNTER — Encounter (HOSPITAL_COMMUNITY)
Admission: AD | Disposition: A | Discharge status: Home / Self Care | Payer: Self-pay | Source: Home / Self Care | Attending: Obstetrics and Gynecology

## 2020-04-30 ENCOUNTER — Encounter (HOSPITAL_COMMUNITY): Payer: Self-pay | Admitting: Obstetrics and Gynecology

## 2020-04-30 DIAGNOSIS — Z20822 Contact with and (suspected) exposure to covid-19: Secondary | ICD-10-CM | POA: Diagnosis present

## 2020-04-30 DIAGNOSIS — O24425 Gestational diabetes mellitus in childbirth, controlled by oral hypoglycemic drugs: Secondary | ICD-10-CM | POA: Diagnosis present

## 2020-04-30 DIAGNOSIS — Z3490 Encounter for supervision of normal pregnancy, unspecified, unspecified trimester: Secondary | ICD-10-CM

## 2020-04-30 DIAGNOSIS — O34211 Maternal care for low transverse scar from previous cesarean delivery: Secondary | ICD-10-CM | POA: Diagnosis present

## 2020-04-30 DIAGNOSIS — F329 Major depressive disorder, single episode, unspecified: Secondary | ICD-10-CM | POA: Diagnosis present

## 2020-04-30 DIAGNOSIS — O99214 Obesity complicating childbirth: Secondary | ICD-10-CM | POA: Diagnosis present

## 2020-04-30 DIAGNOSIS — O4292 Full-term premature rupture of membranes, unspecified as to length of time between rupture and onset of labor: Secondary | ICD-10-CM | POA: Diagnosis present

## 2020-04-30 DIAGNOSIS — Z3A38 38 weeks gestation of pregnancy: Secondary | ICD-10-CM | POA: Diagnosis not present

## 2020-04-30 DIAGNOSIS — O99344 Other mental disorders complicating childbirth: Secondary | ICD-10-CM | POA: Diagnosis present

## 2020-04-30 DIAGNOSIS — O3663X0 Maternal care for excessive fetal growth, third trimester, not applicable or unspecified: Secondary | ICD-10-CM | POA: Diagnosis present

## 2020-04-30 DIAGNOSIS — Z98891 History of uterine scar from previous surgery: Secondary | ICD-10-CM

## 2020-04-30 DIAGNOSIS — O1002 Pre-existing essential hypertension complicating childbirth: Principal | ICD-10-CM | POA: Diagnosis present

## 2020-04-30 DIAGNOSIS — O26893 Other specified pregnancy related conditions, third trimester: Secondary | ICD-10-CM | POA: Diagnosis present

## 2020-04-30 LAB — CBC
HCT: 36.3 % (ref 36.0–46.0)
HCT: 33.2 % — ABNORMAL LOW (ref 36.0–46.0)
Hemoglobin: 11.6 g/dL — ABNORMAL LOW (ref 12.0–15.0)
Hemoglobin: 10.6 g/dL — ABNORMAL LOW (ref 12.0–15.0)
MCH: 28.6 pg (ref 26.0–34.0)
MCH: 28.8 pg (ref 26.0–34.0)
MCHC: 32 g/dL (ref 30.0–36.0)
MCHC: 31.9 g/dL (ref 30.0–36.0)
MCV: 89.6 fL (ref 80.0–100.0)
MCV: 90.2 fL (ref 80.0–100.0)
Platelets: 253 10*3/uL (ref 150–400)
Platelets: 235 10*3/uL (ref 150–400)
RBC: 4.05 MIL/uL (ref 3.87–5.11)
RBC: 3.68 MIL/uL — ABNORMAL LOW (ref 3.87–5.11)
RDW: 14.7 % (ref 11.5–15.5)
RDW: 14.7 % (ref 11.5–15.5)
WBC: 11.6 10*3/uL — ABNORMAL HIGH (ref 4.0–10.5)
WBC: 18.5 10*3/uL — ABNORMAL HIGH (ref 4.0–10.5)
nRBC: 0 % (ref 0.0–0.2)
nRBC: 0 % (ref 0.0–0.2)

## 2020-04-30 LAB — TYPE AND SCREEN
ABO/RH(D): A POS
Antibody Screen: NEGATIVE

## 2020-04-30 LAB — SARS CORONAVIRUS 2 BY RT PCR (HOSPITAL ORDER, PERFORMED IN ~~LOC~~ HOSPITAL LAB): SARS Coronavirus 2: NEGATIVE

## 2020-04-30 LAB — GLUCOSE, CAPILLARY
Glucose-Capillary: 71 mg/dL (ref 70–99)
Glucose-Capillary: 115 mg/dL — ABNORMAL HIGH (ref 70–99)
Glucose-Capillary: 62 mg/dL — ABNORMAL LOW (ref 70–99)
Glucose-Capillary: 69 mg/dL — ABNORMAL LOW (ref 70–99)
Glucose-Capillary: 104 mg/dL — ABNORMAL HIGH (ref 70–99)

## 2020-04-30 LAB — RPR: RPR Ser Ql: NONREACTIVE

## 2020-04-30 LAB — POCT FERN TEST: POCT Fern Test: POSITIVE

## 2020-04-30 SURGERY — Surgical Case
Anesthesia: Regional

## 2020-04-30 SURGERY — Surgical Case
Anesthesia: Epidural

## 2020-04-30 MED ORDER — DIPHENHYDRAMINE HCL 50 MG/ML IJ SOLN: 12.5000 mg | INTRAMUSCULAR | Status: DC | PRN

## 2020-04-30 MED ORDER — SIMETHICONE 80 MG PO CHEW: 80.0000 mg | CHEWABLE_TABLET | ORAL | Status: DC

## 2020-04-30 MED ORDER — OXYTOCIN-SODIUM CHLORIDE 30-0.9 UT/500ML-% IV SOLN: 2.5000 [IU]/h | INTRAVENOUS | Status: AC

## 2020-04-30 MED ORDER — OXYTOCIN BOLUS FROM INFUSION: 333.0000 mL | Freq: Once | INTRAVENOUS | Status: DC

## 2020-04-30 MED ORDER — ONDANSETRON HCL 4 MG/2ML IJ SOLN
INTRAMUSCULAR | Status: AC
  Filled 2020-04-30: qty 2

## 2020-04-30 MED ORDER — OXYCODONE-ACETAMINOPHEN 5-325 MG PO TABS: 1.0000 | ORAL_TABLET | ORAL | Status: DC | PRN

## 2020-04-30 MED ORDER — OXYCODONE HCL 5 MG/5ML PO SOLN: 5.0000 mg | Freq: Once | ORAL | Status: DC | PRN

## 2020-04-30 MED ORDER — FENTANYL CITRATE (PF) 100 MCG/2ML IJ SOLN
INTRAMUSCULAR | Status: DC | PRN
  Administered 2020-04-30: 100 ug via EPIDURAL

## 2020-04-30 MED ORDER — FAMOTIDINE 20 MG PO TABS
20.0000 mg | ORAL_TABLET | Freq: Every day | ORAL | Status: DC
  Administered 2020-04-30 – 2020-05-02 (×3): 20 mg via ORAL
  Filled 2020-04-30 (×3): qty 1

## 2020-04-30 MED ORDER — SIMETHICONE 80 MG PO CHEW
80.0000 mg | CHEWABLE_TABLET | ORAL | Status: DC | PRN
  Administered 2020-05-02 – 2020-05-03 (×2): 80 mg via ORAL
  Filled 2020-04-30 (×2): qty 1

## 2020-04-30 MED ORDER — OXYTOCIN-SODIUM CHLORIDE 30-0.9 UT/500ML-% IV SOLN
INTRAVENOUS | Status: DC | PRN
  Administered 2020-04-30: 30 [IU] via INTRAVENOUS

## 2020-04-30 MED ORDER — ZOLPIDEM TARTRATE 5 MG PO TABS: 5.0000 mg | ORAL_TABLET | Freq: Every evening | ORAL | Status: DC | PRN

## 2020-04-30 MED ORDER — EPHEDRINE 5 MG/ML INJ: 10.0000 mg | INTRAVENOUS | Status: DC | PRN

## 2020-04-30 MED ORDER — CLINDAMYCIN PHOSPHATE 900 MG/50ML IV SOLN
INTRAVENOUS | Status: AC
  Filled 2020-04-30: qty 50

## 2020-04-30 MED ORDER — GENTAMICIN SULFATE 40 MG/ML IJ SOLN
5.0000 mg/kg | INTRAVENOUS | Status: AC
  Administered 2020-04-30: 530 mg via INTRAVENOUS
  Filled 2020-04-30: qty 13.25

## 2020-04-30 MED ORDER — IBUPROFEN 800 MG PO TABS
800.0000 mg | ORAL_TABLET | Freq: Four times a day (QID) | ORAL | Status: DC
  Administered 2020-05-02 – 2020-05-03 (×6): 800 mg via ORAL
  Filled 2020-04-30 (×6): qty 1

## 2020-04-30 MED ORDER — LIDOCAINE-EPINEPHRINE (PF) 2 %-1:200000 IJ SOLN
INTRAMUSCULAR | Status: DC | PRN
  Administered 2020-04-30 (×2): 5 mL via EPIDURAL

## 2020-04-30 MED ORDER — LACTATED RINGERS IV SOLN
500.0000 mL | INTRAVENOUS | Status: DC | PRN
  Administered 2020-04-30: 1000 mL via INTRAVENOUS

## 2020-04-30 MED ORDER — GENTAMICIN SULFATE 40 MG/ML IJ SOLN: 5.0000 mg/kg | INTRAVENOUS | Status: DC

## 2020-04-30 MED ORDER — SODIUM CHLORIDE (PF) 0.9 % IJ SOLN
INTRAMUSCULAR | Status: DC | PRN
  Administered 2020-04-30: 12 mL/h via EPIDURAL

## 2020-04-30 MED ORDER — SODIUM CHLORIDE 0.9% FLUSH: 3.0000 mL | INTRAVENOUS | Status: DC | PRN

## 2020-04-30 MED ORDER — OXYCODONE-ACETAMINOPHEN 5-325 MG PO TABS: 2.0000 | ORAL_TABLET | ORAL | Status: DC | PRN

## 2020-04-30 MED ORDER — HYDROMORPHONE HCL 1 MG/ML IJ SOLN: 0.2500 mg | INTRAMUSCULAR | Status: DC | PRN

## 2020-04-30 MED ORDER — MENTHOL 3 MG MT LOZG: 1.0000 | LOZENGE | OROMUCOSAL | Status: DC | PRN

## 2020-04-30 MED ORDER — KETOROLAC TROMETHAMINE 30 MG/ML IJ SOLN
30.0000 mg | Freq: Four times a day (QID) | INTRAMUSCULAR | Status: AC
  Administered 2020-05-01 (×4): 30 mg via INTRAVENOUS
  Filled 2020-04-30 (×4): qty 1

## 2020-04-30 MED ORDER — LACTATED RINGERS IV SOLN: INTRAVENOUS | Status: DC

## 2020-04-30 MED ORDER — LORATADINE 10 MG PO TABS
10.0000 mg | ORAL_TABLET | Freq: Every day | ORAL | Status: DC
  Administered 2020-04-30 – 2020-05-03 (×4): 10 mg via ORAL
  Filled 2020-04-30 (×4): qty 1

## 2020-04-30 MED ORDER — SERTRALINE HCL 100 MG PO TABS
100.0000 mg | ORAL_TABLET | Freq: Every evening | ORAL | Status: DC
  Administered 2020-05-01 – 2020-05-02 (×2): 100 mg via ORAL
  Filled 2020-04-30 (×3): qty 1

## 2020-04-30 MED ORDER — FLUTICASONE PROPIONATE 50 MCG/ACT NA SUSP
2.0000 | Freq: Every day | NASAL | Status: DC
  Administered 2020-04-30 – 2020-05-03 (×4): 2 via NASAL
  Filled 2020-04-30: qty 16

## 2020-04-30 MED ORDER — OXYTOCIN-SODIUM CHLORIDE 30-0.9 UT/500ML-% IV SOLN
1.0000 m[IU]/min | INTRAVENOUS | Status: DC
  Administered 2020-04-30: 6 m[IU]/min via INTRAVENOUS
  Administered 2020-04-30: 18 m[IU]/min via INTRAVENOUS
  Administered 2020-04-30: 16 m[IU]/min via INTRAVENOUS
  Administered 2020-04-30: 14 m[IU]/min via INTRAVENOUS
  Administered 2020-04-30: 8 m[IU]/min via INTRAVENOUS
  Administered 2020-04-30: 12 m[IU]/min via INTRAVENOUS
  Administered 2020-04-30: 4 m[IU]/min via INTRAVENOUS
  Administered 2020-04-30: 2 m[IU]/min via INTRAVENOUS
  Administered 2020-04-30: 10 m[IU]/min via INTRAVENOUS
  Filled 2020-04-30: qty 500

## 2020-04-30 MED ORDER — NALBUPHINE HCL 10 MG/ML IJ SOLN: 5.0000 mg | Freq: Once | INTRAMUSCULAR | Status: DC | PRN

## 2020-04-30 MED ORDER — MORPHINE SULFATE (PF) 0.5 MG/ML IJ SOLN
INTRAMUSCULAR | Status: AC
  Filled 2020-04-30: qty 10

## 2020-04-30 MED ORDER — DIBUCAINE (PERIANAL) 1 % EX OINT: 1.0000 "application " | TOPICAL_OINTMENT | CUTANEOUS | Status: DC | PRN

## 2020-04-30 MED ORDER — GENTAMICIN SULFATE 40 MG/ML IJ SOLN
5.0000 mg/kg | INTRAVENOUS | Status: DC
  Filled 2020-04-30: qty 13.25

## 2020-04-30 MED ORDER — OXYCODONE HCL 5 MG PO TABS
5.0000 mg | ORAL_TABLET | ORAL | Status: DC | PRN
  Administered 2020-05-01: 5 mg via ORAL
  Administered 2020-05-02: 10 mg via ORAL
  Administered 2020-05-02 (×3): 5 mg via ORAL
  Administered 2020-05-02: 10 mg via ORAL
  Administered 2020-05-02: 5 mg via ORAL
  Administered 2020-05-03 (×3): 10 mg via ORAL
  Filled 2020-04-30 (×3): qty 2
  Filled 2020-04-30: qty 1
  Filled 2020-04-30: qty 2
  Filled 2020-04-30 (×2): qty 1
  Filled 2020-04-30 (×2): qty 2
  Filled 2020-04-30: qty 1

## 2020-04-30 MED ORDER — DIPHENHYDRAMINE HCL 25 MG PO CAPS
25.0000 mg | ORAL_CAPSULE | ORAL | Status: DC | PRN
  Administered 2020-05-01: 25 mg via ORAL
  Filled 2020-04-30: qty 1

## 2020-04-30 MED ORDER — KETOROLAC TROMETHAMINE 30 MG/ML IJ SOLN
INTRAMUSCULAR | Status: AC
  Filled 2020-04-30: qty 1

## 2020-04-30 MED ORDER — TERBUTALINE SULFATE 1 MG/ML IJ SOLN: 0.2500 mg | Freq: Once | INTRAMUSCULAR | Status: DC | PRN

## 2020-04-30 MED ORDER — MORPHINE SULFATE (PF) 0.5 MG/ML IJ SOLN
INTRAMUSCULAR | Status: DC | PRN
  Administered 2020-04-30: 3 mg via EPIDURAL

## 2020-04-30 MED ORDER — SOD CITRATE-CITRIC ACID 500-334 MG/5ML PO SOLN
30.0000 mL | ORAL | Status: DC | PRN
  Filled 2020-04-30: qty 30

## 2020-04-30 MED ORDER — LIDOCAINE HCL (PF) 2 % IJ SOLN
INTRAMUSCULAR | Status: AC
  Filled 2020-04-30: qty 20

## 2020-04-30 MED ORDER — ONDANSETRON HCL 4 MG/2ML IJ SOLN: 4.0000 mg | Freq: Four times a day (QID) | INTRAMUSCULAR | Status: DC | PRN

## 2020-04-30 MED ORDER — EPINEPHRINE PF 1 MG/ML IJ SOLN
INTRAMUSCULAR | Status: AC
  Filled 2020-04-30: qty 1

## 2020-04-30 MED ORDER — METFORMIN HCL 500 MG PO TABS
500.0000 mg | ORAL_TABLET | Freq: Every day | ORAL | Status: DC
  Administered 2020-05-01 – 2020-05-03 (×3): 500 mg via ORAL
  Filled 2020-04-30 (×3): qty 1

## 2020-04-30 MED ORDER — SIMETHICONE 80 MG PO CHEW
80.0000 mg | CHEWABLE_TABLET | Freq: Three times a day (TID) | ORAL | Status: DC
  Administered 2020-05-01 – 2020-05-03 (×7): 80 mg via ORAL
  Filled 2020-04-30 (×7): qty 1

## 2020-04-30 MED ORDER — OXYCODONE HCL 5 MG PO TABS: 5.0000 mg | ORAL_TABLET | Freq: Once | ORAL | Status: DC | PRN

## 2020-04-30 MED ORDER — ONDANSETRON HCL 4 MG/2ML IJ SOLN: 4.0000 mg | Freq: Once | INTRAMUSCULAR | Status: DC | PRN

## 2020-04-30 MED ORDER — FENTANYL-BUPIVACAINE-NACL 0.5-0.125-0.9 MG/250ML-% EP SOLN
12.0000 mL/h | EPIDURAL | Status: DC | PRN
  Administered 2020-04-30: 12 mL/h via EPIDURAL
  Filled 2020-04-30: qty 250

## 2020-04-30 MED ORDER — PRENATAL MULTIVITAMIN CH
1.0000 | ORAL_TABLET | Freq: Every day | ORAL | Status: DC
  Administered 2020-05-01 – 2020-05-02 (×2): 1 via ORAL
  Filled 2020-04-30 (×3): qty 1

## 2020-04-30 MED ORDER — HYDROXYZINE HCL 50 MG PO TABS: 50.0000 mg | ORAL_TABLET | Freq: Four times a day (QID) | ORAL | Status: DC | PRN

## 2020-04-30 MED ORDER — OXYTOCIN-SODIUM CHLORIDE 30-0.9 UT/500ML-% IV SOLN: 2.5000 [IU]/h | INTRAVENOUS | Status: DC

## 2020-04-30 MED ORDER — DIPHENHYDRAMINE HCL 25 MG PO CAPS: 25.0000 mg | ORAL_CAPSULE | Freq: Four times a day (QID) | ORAL | Status: DC | PRN

## 2020-04-30 MED ORDER — TETANUS-DIPHTH-ACELL PERTUSSIS 5-2.5-18.5 LF-MCG/0.5 IM SUSP: 0.5000 mL | Freq: Once | INTRAMUSCULAR | Status: DC

## 2020-04-30 MED ORDER — PHENYLEPHRINE 40 MCG/ML (10ML) SYRINGE FOR IV PUSH (FOR BLOOD PRESSURE SUPPORT): 80.0000 ug | PREFILLED_SYRINGE | INTRAVENOUS | Status: DC | PRN

## 2020-04-30 MED ORDER — ACETAMINOPHEN 500 MG PO TABS
1000.0000 mg | ORAL_TABLET | Freq: Four times a day (QID) | ORAL | Status: DC
  Administered 2020-04-30 – 2020-05-03 (×9): 1000 mg via ORAL
  Filled 2020-04-30 (×10): qty 2

## 2020-04-30 MED ORDER — BUTORPHANOL TARTRATE 1 MG/ML IJ SOLN: 1.0000 mg | INTRAMUSCULAR | Status: DC | PRN

## 2020-04-30 MED ORDER — KETOROLAC TROMETHAMINE 30 MG/ML IJ SOLN
30.0000 mg | Freq: Once | INTRAMUSCULAR | Status: AC | PRN
  Administered 2020-04-30: 30 mg via INTRAVENOUS

## 2020-04-30 MED ORDER — CLINDAMYCIN PHOSPHATE 900 MG/50ML IV SOLN
900.0000 mg | INTRAVENOUS | Status: DC
Start: 2020-04-30 — End: 2020-04-30

## 2020-04-30 MED ORDER — DEXAMETHASONE SODIUM PHOSPHATE 10 MG/ML IJ SOLN
INTRAMUSCULAR | Status: AC
  Filled 2020-04-30: qty 1

## 2020-04-30 MED ORDER — NALOXONE HCL 0.4 MG/ML IJ SOLN: 0.4000 mg | INTRAMUSCULAR | Status: DC | PRN

## 2020-04-30 MED ORDER — NALBUPHINE HCL 10 MG/ML IJ SOLN: 5.0000 mg | INTRAMUSCULAR | Status: DC | PRN

## 2020-04-30 MED ORDER — ONDANSETRON HCL 4 MG/2ML IJ SOLN
INTRAMUSCULAR | Status: DC | PRN
  Administered 2020-04-30: 4 mg via INTRAVENOUS

## 2020-04-30 MED ORDER — SODIUM CHLORIDE 0.9 % IR SOLN
Status: DC | PRN
  Administered 2020-04-30: 1

## 2020-04-30 MED ORDER — LACTATED RINGERS IV SOLN: 500.0000 mL | Freq: Once | INTRAVENOUS | Status: DC

## 2020-04-30 MED ORDER — LIDOCAINE HCL (PF) 1 % IJ SOLN
INTRAMUSCULAR | Status: DC | PRN
  Administered 2020-04-30: 11 mL via EPIDURAL

## 2020-04-30 MED ORDER — LIDOCAINE HCL (PF) 1 % IJ SOLN: 30.0000 mL | INTRAMUSCULAR | Status: DC | PRN

## 2020-04-30 MED ORDER — PHENYLEPHRINE 40 MCG/ML (10ML) SYRINGE FOR IV PUSH (FOR BLOOD PRESSURE SUPPORT)
80.0000 ug | PREFILLED_SYRINGE | INTRAVENOUS | Status: DC | PRN
  Filled 2020-04-30: qty 10

## 2020-04-30 MED ORDER — FENTANYL CITRATE (PF) 100 MCG/2ML IJ SOLN
INTRAMUSCULAR | Status: AC
  Filled 2020-04-30: qty 2

## 2020-04-30 MED ORDER — WITCH HAZEL-GLYCERIN EX PADS: 1.0000 "application " | MEDICATED_PAD | CUTANEOUS | Status: DC | PRN

## 2020-04-30 MED ORDER — SOD CITRATE-CITRIC ACID 500-334 MG/5ML PO SOLN
30.0000 mL | ORAL | Status: AC
  Administered 2020-04-30: 30 mL via ORAL

## 2020-04-30 MED ORDER — SENNOSIDES-DOCUSATE SODIUM 8.6-50 MG PO TABS
2.0000 | ORAL_TABLET | ORAL | Status: DC
  Administered 2020-05-01 – 2020-05-03 (×3): 2 via ORAL
  Filled 2020-04-30 (×3): qty 2

## 2020-04-30 MED ORDER — CLINDAMYCIN PHOSPHATE 900 MG/50ML IV SOLN: 900.0000 mg | INTRAVENOUS | Status: DC

## 2020-04-30 MED ORDER — COCONUT OIL OIL: 1.0000 "application " | TOPICAL_OIL | Status: DC | PRN

## 2020-04-30 MED ORDER — ONDANSETRON HCL 4 MG/2ML IJ SOLN: 4.0000 mg | Freq: Three times a day (TID) | INTRAMUSCULAR | Status: DC | PRN

## 2020-04-30 MED ORDER — SODIUM CHLORIDE 0.9 % IV SOLN
INTRAVENOUS | Status: DC | PRN
Start: 2020-04-30 — End: 2020-04-30

## 2020-04-30 MED ORDER — NALOXONE HCL 4 MG/10ML IJ SOLN
1.0000 ug/kg/h | INTRAVENOUS | Status: DC | PRN
  Filled 2020-04-30: qty 5

## 2020-04-30 MED ORDER — CLINDAMYCIN PHOSPHATE 900 MG/50ML IV SOLN
900.0000 mg | INTRAVENOUS | Status: AC
  Administered 2020-04-30: 900 mg via INTRAVENOUS

## 2020-04-30 MED ORDER — SCOPOLAMINE 1 MG/3DAYS TD PT72
MEDICATED_PATCH | TRANSDERMAL | Status: DC | PRN
  Administered 2020-04-30: 1 via TRANSDERMAL

## 2020-04-30 MED ORDER — PHENYLEPHRINE 40 MCG/ML (10ML) SYRINGE FOR IV PUSH (FOR BLOOD PRESSURE SUPPORT)
PREFILLED_SYRINGE | INTRAVENOUS | Status: AC
  Filled 2020-04-30: qty 10

## 2020-04-30 MED ORDER — SCOPOLAMINE 1 MG/3DAYS TD PT72: 1.0000 | MEDICATED_PATCH | Freq: Once | TRANSDERMAL | Status: DC

## 2020-04-30 MED ORDER — HYDROMORPHONE HCL 1 MG/ML IJ SOLN: 0.2000 mg | INTRAMUSCULAR | Status: DC | PRN

## 2020-04-30 MED ORDER — ACETAMINOPHEN 325 MG PO TABS: 650.0000 mg | ORAL_TABLET | ORAL | Status: DC | PRN

## 2020-04-30 MED ORDER — PHENYLEPHRINE 40 MCG/ML (10ML) SYRINGE FOR IV PUSH (FOR BLOOD PRESSURE SUPPORT)
PREFILLED_SYRINGE | INTRAVENOUS | Status: DC | PRN
  Administered 2020-04-30 (×5): 80 ug via INTRAVENOUS

## 2020-04-30 SURGICAL SUPPLY — 41 items
APL SKNCLS STERI-STRIP NONHPOA (GAUZE/BANDAGES/DRESSINGS) ×1
BENZOIN TINCTURE PRP APPL 2/3 (GAUZE/BANDAGES/DRESSINGS) ×2 IMPLANT
CHLORAPREP W/TINT 26ML (MISCELLANEOUS) ×2 IMPLANT
CLAMP CORD UMBIL (MISCELLANEOUS) IMPLANT
CLOTH BEACON ORANGE TIMEOUT ST (SAFETY) ×2 IMPLANT
CLSR STERI-STRIP ANTIMIC 1/2X4 (GAUZE/BANDAGES/DRESSINGS) ×2 IMPLANT
DRESSING PREVENA PLUS CUSTOM (GAUZE/BANDAGES/DRESSINGS) IMPLANT
DRSG OPSITE POSTOP 4X10 (GAUZE/BANDAGES/DRESSINGS) ×2 IMPLANT
DRSG PREVENA PLUS CUSTOM (GAUZE/BANDAGES/DRESSINGS) ×2
ELECT REM PT RETURN 9FT ADLT (ELECTROSURGICAL) ×2
ELECTRODE REM PT RTRN 9FT ADLT (ELECTROSURGICAL) ×1 IMPLANT
EXTRACTOR VACUUM KIWI (MISCELLANEOUS) ×1 IMPLANT
GLOVE BIO SURGEON STRL SZ 6.5 (GLOVE) ×2 IMPLANT
GLOVE BIOGEL PI IND STRL 6.5 (GLOVE) ×1 IMPLANT
GLOVE BIOGEL PI IND STRL 7.0 (GLOVE) ×2 IMPLANT
GLOVE BIOGEL PI INDICATOR 6.5 (GLOVE) ×1
GLOVE BIOGEL PI INDICATOR 7.0 (GLOVE) ×2
GOWN STRL REUS W/TWL LRG LVL3 (GOWN DISPOSABLE) ×4 IMPLANT
HOVERMATT SINGLE USE (MISCELLANEOUS) ×1 IMPLANT
KIT ABG SYR 3ML LUER SLIP (SYRINGE) ×2 IMPLANT
NDL HYPO 25X5/8 SAFETYGLIDE (NEEDLE) ×1 IMPLANT
NEEDLE HYPO 25X5/8 SAFETYGLIDE (NEEDLE) ×2 IMPLANT
NS IRRIG 1000ML POUR BTL (IV SOLUTION) ×2 IMPLANT
PACK C SECTION WH (CUSTOM PROCEDURE TRAY) ×2 IMPLANT
PAD OB MATERNITY 4.3X12.25 (PERSONAL CARE ITEMS) ×2 IMPLANT
PENCIL SMOKE EVAC W/HOLSTER (ELECTROSURGICAL) ×2 IMPLANT
RETRACTOR TRAXI PANNICULUS (MISCELLANEOUS) IMPLANT
SUT PLAIN 0 NONE (SUTURE) IMPLANT
SUT PLAIN 2 0 (SUTURE) ×2
SUT PLAIN 2 0 XLH (SUTURE) ×1 IMPLANT
SUT PLAIN ABS 2-0 CT1 27XMFL (SUTURE) ×1 IMPLANT
SUT VIC AB 0 CT1 36 (SUTURE) ×3 IMPLANT
SUT VIC AB 0 CTX 36 (SUTURE) ×4
SUT VIC AB 0 CTX36XBRD ANBCTRL (SUTURE) ×2 IMPLANT
SUT VIC AB 2-0 SH 27 (SUTURE) ×2
SUT VIC AB 2-0 SH 27XBRD (SUTURE) IMPLANT
SUT VIC AB 4-0 PS2 27 (SUTURE) ×2 IMPLANT
TOWEL OR 17X24 6PK STRL BLUE (TOWEL DISPOSABLE) ×2 IMPLANT
TRAXI PANNICULUS RETRACTOR (MISCELLANEOUS) ×1
TRAY FOLEY W/BAG SLVR 14FR LF (SET/KITS/TRAYS/PACK) IMPLANT
WATER STERILE IRR 1000ML POUR (IV SOLUTION) ×2 IMPLANT

## 2020-04-30 NOTE — Op Note (Signed)
PROCEDURE DATE: 04/30/20  PREOPERATIVE DIAGNOSIS: Intrauterine pregnancy at 37.4 wga, Indication: arrest of dilation, nrFHT, repeat cesarean section, suspected macrosomia  POSTOPERATIVE DIAGNOSIS:The same  PROCEDURE: Repeat Low TransverseCesarean Section  SURGEON: Dr. Belva Agee  INDICATIONS:This is a 68AY [id number] at 37.4 wga requiring cesarean section secondary to  arrest of dilation, nrFHT, repeat cesarean section, suspected macrosomia. She presented s/p PROM. She elected a VTOLAC. She progressed minimally on high dose pitocin despite adequate MVUs. Recurrent late decels noted.  Decision made to proceed with RLTCS.The risks of cesarean section discussed with the patient included but were not limited to: bleeding which may require transfusion or reoperation; infection which may require antibiotics; injury to bowel, bladder, ureters or other surrounding organs; injury to the fetus; need for additional procedures including hysterectomy in the event of a life-threatening hemorrhage; placental abnormalities wth subsequent pregnancies, incisional problems, thromboembolic phenomenon and other postoperative/anesthesia complications. The patient agreed with the proposed plan, giving informed consent for the procedure.   FINDINGS: Viable maleinfant in vertex presentation,APGARs pending, Weight pending, Amniotic fluid clear, Intact placenta, three vessel cord. Grossly normal uterus. Extremely dense adhesive disease with extensive SQ tissue between fascial layers only on the right.  ANESTHESIA: Epidural ESTIMATED BLOOD LOSS: final pending SPECIMENS: Placenta for L&D COMPLICATIONS: None immediate  PROCEDURE IN DETAIL: The patient received intravenous antibiotics  and had sequential compression devices applied to her lower extremities while in the preoperative area. Shewasthen taken to the operating roomwhere epidural anesthesiawas dosed up to surgical level andwas found to be  adequate. She was then placed in a dorsal supine position with a leftward tilt,and prepped and draped in a sterile manner.A foley catheter was placed into her bladder and attached to constant gravity. After an adequate timeout was performed, aPfannenstiel skin incision was made with scalpel and carried through to the underlying layer of fascia. The fascia was incised in the midline and this incision was extended bilaterally using the Mayo scissors. Kocher clamps were applied to the superior aspect of the fascial incision and the underlying rectus muscles were dissected off bluntly. A similar process was carried out on the inferior aspect of the facial incision. The rectus muscles were separated in the midline bluntly and the peritoneum was entered bluntly. A bladder flap was created sharply and developed bluntly.Atransverse hysterotomy was made with a scalpel and extended bilaterally bluntly. The bladder blade was then removed. The infant was successfully delivered, and cord was clamped and cut and infant was handed over to awaiting neonatology team. Uterine massage was then administered and the placenta delivered intact with three-vessel cord. Cord gases were taken. The uterus was cleared of clot and debris. The hysterotomy was closed with 0 vicryl.A second imbricating suture of 0-vicryl was used to reinforce the incision and aid in hemostasis.The right fascia was noted to be in two separate layers with ~ 2cm of SQ tissue in between. Thus it was closed in two separate layers with 0/vicryl in a running stitch. These two layers were continued with the left side which was noted to have the normal anatomy and closed with 0-Vicryl in a running fashion with good restoration of anatomy.The subcutaneus tissue was irrigated and was reapproximated using three interrupted plain gut stitches. The skin was closed with 4-0 Vicryl in a subcuticular fashion. A wound vac was placed.   All surgical site and was  hemostatic at end of procedure) without any further bleeding on exam.   It's a boy - "Fredricka Bonine" to join sister Lauris Poag!!   Pt  tolerated the procedure well. All sponge/lap/needle counts were correct X 2. Pt taken to recovery room in stable condition.   Belva Agee MD

## 2020-04-30 NOTE — MAU Note (Signed)
Pt reports water broke around 1:30am with clear fluid.  Reports good fetal movement.  States CTX started soon after water broke.

## 2020-04-30 NOTE — H&P (Addendum)
Tina Diaz is a 39 y.o. female presenting for ROM. Pregnancy c/b several issues. She has known glucose intolerance and was on metformin prior to pregnancy. This pregnancy her metformin was increased to 1000mg  at night and 500mg  in the AM. Glyburide 70mq QHS was also added. She has a hx cHTN not requiring medications. She has been on baby asa for PIH prevention and had reassuring antenatal testing overall. Serial growth showed LGA. Recent growth in the office showed EFW of 8#8 last week and her cervix was closed/long/high. She has a hx LGA (8#14 at 38.2 wga, [name]) in first pregnancy. However, she did not have a TOL and was scheduled for a primary cesarean section. She has a hx of depression an dis on Zoloft 100mg . She is morbidly obese. She is ama and had RR panorama (female).  OB History    Gravida  3   Para  1   Term  1   Preterm  0   AB  1   Living  1     SAB  1   TAB  0   Ectopic  0   Multiple  0   Live Births  1          Past Medical History:  Diagnosis Date  . Anxiety   . Depression    situational  . Gestational diabetes   . History of PCOS    with hirtsutism  . Hypertension   . Impaired glucose tolerance   . Morbid obesity (HCC)   . Pregnant    Past Surgical History:  Procedure Laterality Date  . CESAREAN SECTION N/A 09/01/2017   Procedure: CESAREAN SECTION;  Surgeon: [place], MD;  Location: [facility] BIRTHING SUITES;  Service: Obstetrics;  Laterality: N/A;  Primary edc 12/16 allergy to augmentin need RNFA   Family History: family history includes Cancer in her maternal grandmother; Diabetes in her father, paternal grandfather, and paternal grandmother; Heart disease in her father and maternal grandfather; Hypertension in her father and maternal grandfather; Ovarian cancer (age of onset: 13) in her maternal grandmother. Social History:  reports that she has never smoked. She has never used smokeless tobacco. She reports previous alcohol use. She  reports that she does not use drugs.     Maternal Diabetes: Yes:  Diabetes Type:  Pre-pregnancy Genetic Screening: Normal Maternal Ultrasounds/Referrals: Normal Fetal Ultrasounds or other Referrals:  None Maternal Substance Abuse:  No Significant Maternal Medications:  None Significant Maternal Lab Results:  None Other Comments:  None  Review of Systems History   Blood pressure 125/85, pulse 94, temperature 97.8 F (36.6 C), temperature source Oral, resp. rate 18, last menstrual period 08/11/2019, SpO2 98 %. Exam Physical Exam  NAD, A&O NWOB Abd soft, nondistended, gravid SVE 3/90/-1, vertex  Prenatal labs: ABO, Rh: A/Positive/-- (01/14 0000) Antibody: Negative (01/14 0000) Rubella: Immune (01/14 0000) RPR: Nonreactive (01/14 0000)  HBsAg: Negative (01/14 0000)  HIV: Non-reactive (01/14 0000)  GBS: Negative/-- (07/20 0000)   Assessment/Plan: 39 yo G3P1011 presenting for ROM and early labor.  Risks of VOTLAC vs CS d/w pt. We reviewed risks of uterine rupture and the 10% chance of neonatal morbidity/mortality if that were to occur. We discussed the possibility of failure and if failure happens, outcomes are worse. We discussed risks of a CS including infection, bleeding, damage to surrounding structures, the need for additional procedures, abnormal placentation in the future, and the requirement of CS in the future. We sat down and had an extensive conversation  regarding the above. We took carefully into consideration her recent US finding, previous CS indication ( elective s/s suspected LGA), her morbid obesity, her GDM, her current SVE, etc. EFW on my leopolds today 8#10. [place] did show elevated AC and HC. Her pelvis feels adequate. I reiteretaed the risks of a shoulder dystocia. After careful consideration, she elects a VTOLAC.   Plan metformin 500mg  pp with BS testing and diabetic diet. Plan for likely wound vac pp to help with wound healing if ends up needing CS. Continue  zoloft. Patient agrees to proceed.    04/30/2020, 8:39 AM

## 2020-04-30 NOTE — Progress Notes (Signed)
Comf w/cle SVE 4/c/-2 IUPC placed Cont picotin

## 2020-04-30 NOTE — Anesthesia Preprocedure Evaluation (Addendum)
Anesthesia Evaluation  Patient identified by MRN, date of birth, ID band Patient awake    Reviewed: Allergy & Precautions, NPO status , Patient's Chart, lab work & pertinent test results  Airway Mallampati: II  TM Distance: >3 FB Neck ROM: Full    Dental no notable dental hx.    Pulmonary neg pulmonary ROS,    Pulmonary exam normal breath sounds clear to auscultation       Cardiovascular hypertension, negative cardio ROS Normal cardiovascular exam Rhythm:Regular Rate:Normal     Neuro/Psych Anxiety Depression negative neurological ROS  negative psych ROS   GI/Hepatic negative GI ROS, Neg liver ROS,   Endo/Other  diabetes, GestationalMorbid obesity  Renal/GU negative Renal ROS  negative genitourinary   Musculoskeletal negative musculoskeletal ROS (+)   Abdominal (+) + obese,   Peds negative pediatric ROS (+)  Hematology negative hematology ROS (+)   Anesthesia Other Findings   Reproductive/Obstetrics (+) Pregnancy                             Anesthesia Physical  Anesthesia Plan  ASA: III  Anesthesia Plan: Epidural   Post-op Pain Management:    Induction:   PONV Risk Score and Plan: 2 and Treatment may vary due to age or medical condition, Ondansetron and Scopolamine patch - Pre-op  Airway Management Planned: Natural Airway  Additional Equipment:   Intra-op Plan:   Post-operative Plan:   Informed Consent: I have reviewed the patients History and Physical, chart, labs and discussed the procedure including the risks, benefits and alternatives for the proposed anesthesia with the patient or authorized representative who has indicated his/her understanding and acceptance.     Dental advisory given  Plan Discussed with:   Anesthesia Plan Comments:         Anesthesia Quick Evaluation

## 2020-04-30 NOTE — Progress Notes (Signed)
Inpatient Diabetes Program Recommendations  Diabetes Treatment Program Recommendations  ADA Standards of Care 2018 Diabetes in Pregnancy Target Glucose Ranges:  Fasting: 60 - 90 mg/dL Preprandial: 60 - 449 mg/dL 1 hr postprandial: Less than 140mg /dL (from first bite of meal) 2 hr postprandial: Less than 120 mg/dL (from first bite of meal)    Lab Results  Component Value Date   GLUCAP 90 09/04/2017    Diabetes history: GDM Outpatient Diabetes medications: Metformin 500 mg QAM, 1000 mg QPM, Glyburide 5 mg QAM Current orders for Inpatient glycemic control: CBGs Q2H  Inpatient Diabetes Program Recommendations:    Noted consult and current orders. In agreement. Following.   Thanks, 14/03/2017, MSN, RNC-OB Diabetes Coordinator [phone] (8a-5p)

## 2020-04-30 NOTE — Transfer of Care (Signed)
Immediate Anesthesia Transfer of Care Note  Patient: Tina Diaz  Procedure(s) Performed: CESAREAN SECTION (N/A )  Patient Location: PACU  Anesthesia Type:Epidural  Level of Consciousness: awake, alert  and oriented  Airway & Oxygen Therapy: Patient Spontanous Breathing  Post-op Assessment: Report given to RN and Post -op Vital signs reviewed and stable  Post vital signs: Reviewed and stable  Last Vitals:  Vitals Value Taken Time  BP 126/76 04/30/20 2116  Temp    Pulse 90 04/30/20 2122  Resp 21 04/30/20 2122  SpO2 100 % 04/30/20 2122  Vitals shown include unvalidated device data.  Last Pain:  Vitals:   04/30/20 1801  TempSrc: Oral  PainSc:          Complications: No complications documented.

## 2020-04-30 NOTE — Anesthesia Postprocedure Evaluation (Signed)
Anesthesia Post Note  Patient: Tina Diaz  Procedure(s) Performed: CESAREAN SECTION (N/A )     Patient location during evaluation: Mother Baby Anesthesia Type: Epidural Level of consciousness: oriented and awake and alert Pain management: pain level controlled Vital Signs Assessment: post-procedure vital signs reviewed and stable Respiratory status: spontaneous breathing and respiratory function stable Cardiovascular status: blood pressure returned to baseline and stable Postop Assessment: no headache, no backache, no apparent nausea or vomiting and able to ambulate Anesthetic complications: no   No complications documented.  Last Vitals:  Vitals:   04/30/20 2206 04/30/20 2207  BP:    Pulse: 90 91  Resp: 15 15  Temp:    SpO2: 97% 96%    Last Pain:  Vitals:   04/30/20 2130  TempSrc:   PainSc: 0-No pain   Pain Goal:                Epidural/Spinal Function Cutaneous sensation: Able to Wiggle Toes (04/30/20 2200), Patient able to flex knees: Yes (04/30/20 2200), Patient able to lift hips off bed: No (04/30/20 2200), Back pain beyond tenderness at insertion site: No (04/30/20 2200), Progressively worsening motor and/or sensory loss: No (04/30/20 2200), Bowel and/or bladder incontinence post epidural: No (04/30/20 2200)  Trevor Iha

## 2020-04-30 NOTE — Anesthesia Procedure Notes (Signed)
Epidural Patient location during procedure: OB Start time: 04/30/2020 1:49 PM End time: 04/30/2020 2:02 PM  Staffing Anesthesiologist: Lowella Curb, MD Performed: anesthesiologist   Preanesthetic Checklist Completed: patient identified, IV checked, site marked, risks and benefits discussed, surgical consent, monitors and equipment checked, pre-op evaluation and timeout performed  Epidural Patient position: sitting Prep: ChloraPrep Patient monitoring: heart rate, cardiac monitor, continuous pulse ox and blood pressure Approach: midline Location: L2-L3 Injection technique: LOR saline  Needle:  Needle type: Tuohy  Needle gauge: 17 G Needle length: 9 cm Needle insertion depth: 9 cm Catheter type: closed end flexible Catheter size: 20 Guage Catheter at skin depth: 14 cm Test dose: negative  Assessment Events: blood not aspirated, injection not painful, no injection resistance, no paresthesia and negative IV test  Additional Notes Epidural placed by SRNA under direct supervisionReason for block:procedure for pain

## 2020-04-30 NOTE — Progress Notes (Signed)
SVE unchanged and now swollen and tight despite adequate MVUs for hours.  FHT with late decelerations. Pitocin already at a high dose. Recommend repeat cesarean section and patient and her husband are in agreement. Risks discussed including infection, bleeding, damage to surrounding structures, the need for additional procedures including hysterectomy, and the possibility of uterine rupture with neonatal morbidity/mortality, scarring, and abnormal placentation with subsequent pregnancies. Patient agrees to proceed. Gent/clinda on call to OR.    Rosie Fate MD

## 2020-05-01 ENCOUNTER — Other Ambulatory Visit (HOSPITAL_COMMUNITY)
Admission: RE | Admit: 2020-05-01 | Discharge: 2020-05-01 | Disposition: A | Discharge status: Home / Self Care | Payer: BC Managed Care – PPO | Source: Ambulatory Visit

## 2020-05-01 ENCOUNTER — Encounter (HOSPITAL_COMMUNITY): Payer: Self-pay | Admitting: Obstetrics and Gynecology

## 2020-05-01 HISTORY — DX: Essential (primary) hypertension: I10

## 2020-05-01 HISTORY — DX: Gestational diabetes mellitus in pregnancy, unspecified control: O24.419

## 2020-05-01 LAB — CBC
HCT: 31.1 % — ABNORMAL LOW (ref 36.0–46.0)
Hemoglobin: 10 g/dL — ABNORMAL LOW (ref 12.0–15.0)
MCH: 28.7 pg (ref 26.0–34.0)
MCHC: 32.2 g/dL (ref 30.0–36.0)
MCV: 89.4 fL (ref 80.0–100.0)
Platelets: 223 10*3/uL (ref 150–400)
RBC: 3.48 MIL/uL — ABNORMAL LOW (ref 3.87–5.11)
RDW: 14.8 % (ref 11.5–15.5)
WBC: 13.1 10*3/uL — ABNORMAL HIGH (ref 4.0–10.5)
nRBC: 0 % (ref 0.0–0.2)

## 2020-05-01 LAB — GLUCOSE, CAPILLARY
Glucose-Capillary: 113 mg/dL — ABNORMAL HIGH (ref 70–99)
Glucose-Capillary: 113 mg/dL — ABNORMAL HIGH (ref 70–99)
Glucose-Capillary: 134 mg/dL — ABNORMAL HIGH (ref 70–99)
Glucose-Capillary: 134 mg/dL — ABNORMAL HIGH (ref 70–99)
Glucose-Capillary: 85 mg/dL (ref 70–99)
Glucose-Capillary: 104 mg/dL — ABNORMAL HIGH (ref 70–99)
Glucose-Capillary: 109 mg/dL — ABNORMAL HIGH (ref 70–99)

## 2020-05-01 MED ORDER — INSULIN ASPART 100 UNIT/ML ~~LOC~~ SOLN: 0.0000 [IU] | Freq: Three times a day (TID) | SUBCUTANEOUS | Status: DC

## 2020-05-01 MED ORDER — LACTATED RINGERS IV BOLUS
500.0000 mL | Freq: Once | INTRAVENOUS | Status: AC
  Administered 2020-05-01: 500 mL via INTRAVENOUS

## 2020-05-01 NOTE — Lactation Note (Signed)
This note was copied from a baby's chart. Lactation Consultation Note  Patient Name: Tina Diaz [id number] Date: 05/01/2020 Reason for consult: Initial assessment;Early term 37-38.6wks;Other (Comment);Maternal endocrine disorder (baby had low blood sugars - stable now - see results review/ mom encouraged to call with feeding cues) Type of Endocrine Disorder?: Diabetes  [name] 18 hours old , mom experienced BF of 13 months. ( explained she had challenges in the beginning and did a lot of pumping and 1st baby eventually breast fed. ( 1st baby was ET too).  As LC entered the room, baby in the crib asleep and mom resting in bed and dad on the couch.  Per mom last fed at 1225 - 10 ml and was still hungry 1/2 later and fed 5 more.  Baby has had wets and stools.  LC recommended even though the last 2 bloods sugars were WNL - 46, 44 to feed with feeding cues and by 3 hours to check the diaper, change if needed and attempt to feed STS . If the baby is still sluggish try with an appetizer of EBM or formula and then work on the latching.  LC reviewed potential feeding behaviors with Early term infant and provided the hand out for LPT and explained they can be very similar.  Mom already had a hand pump and a DEBP. Per mom has been pumping with #24 F and its comfortable. Did not need a review of the settings.  LC provided the [facility] pamphlet with phone numbers.   Mom aware to call with feeding cues for feeding assessment.    Maternal Data    Feeding Feeding Type:  (per mom last fed at around 1 pm . - see LC note)  LATCH Score                   Interventions Interventions: Breast feeding basics reviewed;DEBP;Hand pump  Lactation Tools Discussed/Used Tools: Pump;Flanges Flange Size: 24 (per mom mentioned #24 F is comfortable) Breast pump type: Manual;Double-Electric Breast Pump   Consult Status Consult Status: Follow-up Date: 05/01/20 Follow-up type: In-patient    Tina Diaz 05/01/2020, 2:17 PM

## 2020-05-01 NOTE — Progress Notes (Signed)
POD # 1  Doing very well. Pain is minimal.  BP (!) 115/56 (BP Location: Left Arm)    Pulse 78    Temp 97.9 F (36.6 C) (Oral)    Resp 18    Ht 5\' 11"  (1.803 m)    Wt (!) 160.6 kg    LMP 08/11/2019    SpO2 97%    BMI 49.37 kg/m   Results for orders placed or performed during the hospital encounter of 04/30/20 (from the past 24 hour(s))  Glucose, capillary     Status: None   Collection Time: 04/30/20 10:57 AM  Result Value Ref Range   Glucose-Capillary 71 70 - 99 mg/dL  Glucose, capillary     Status: Abnormal   Collection Time: 04/30/20  3:13 PM  Result Value Ref Range   Glucose-Capillary 115 (H) 70 - 99 mg/dL  Glucose, capillary     Status: Abnormal   Collection Time: 04/30/20  6:46 PM  Result Value Ref Range   Glucose-Capillary 62 (L) 70 - 99 mg/dL  Glucose, capillary     Status: Abnormal   Collection Time: 04/30/20  7:02 PM  Result Value Ref Range   Glucose-Capillary 69 (L) 70 - 99 mg/dL  Glucose, capillary     Status: Abnormal   Collection Time: 04/30/20  9:25 PM  Result Value Ref Range   Glucose-Capillary 104 (H) 70 - 99 mg/dL  CBC     Status: Abnormal   Collection Time: 04/30/20  9:55 PM  Result Value Ref Range   WBC 18.5 (H) 4.0 - 10.5 K/uL   RBC 3.68 (L) 3.87 - 5.11 MIL/uL   Hemoglobin 10.6 (L) 12.0 - 15.0 g/dL   HCT 06/30/20 (L) 36 - 46 %   MCV 90.2 80.0 - 100.0 fL   MCH 28.8 26.0 - 34.0 pg   MCHC 31.9 30.0 - 36.0 g/dL   RDW 32.4 40.1 - 02.7 %   Platelets 235 150 - 400 K/uL   nRBC 0.0 0.0 - 0.2 %  Glucose, capillary     Status: Abnormal   Collection Time: 05/01/20  1:06 AM  Result Value Ref Range   Glucose-Capillary 113 (H) 70 - 99 mg/dL  CBC     Status: Abnormal   Collection Time: 05/01/20  4:50 AM  Result Value Ref Range   WBC 13.1 (H) 4.0 - 10.5 K/uL   RBC 3.48 (L) 3.87 - 5.11 MIL/uL   Hemoglobin 10.0 (L) 12.0 - 15.0 g/dL   HCT 07/01/20 (L) 36 - 46 %   MCV 89.4 80.0 - 100.0 fL   MCH 28.7 26.0 - 34.0 pg   MCHC 32.2 30.0 - 36.0 g/dL   RDW 66.4 40.3 - 47.4 %    Platelets 223 150 - 400 K/uL   nRBC 0.0 0.0 - 0.2 %  Glucose, capillary     Status: Abnormal   Collection Time: 05/01/20  5:07 AM  Result Value Ref Range   Glucose-Capillary 134 (H) 70 - 99 mg/dL   Comment 1 Notify RN    Comment 2 Document in Chart    Abdomen is soft and non tender Bandage /wound vac is dry  POD # 1 Doing well Continue Wound Vac DM  - start metformin  Change cbg to after meals and fasting

## 2020-05-01 NOTE — Progress Notes (Signed)
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  CSW met with MOB to offer support and complete assessment.    CSW congratulated MOB and FOB on the birth of infant. CSW advised Mob of CSW's role and the reason for CSW coming to visit with her. MOB reported that she was diagnosed with PPD in 2019 after the birth of her first child. MOB reported that she felt overwhelmed, anxious about caring for infant and other things. MOB reported that she was placed on Zoloft to help with this in which MOB reports this medication has been very helpful in managing her depression. MOB reported that she has been feeling well since giving birth and denies SI and HI. MOB reported that she has all needed items to care for infant with no other concerns.    CSW provided education regarding the baby blues period vs. perinatal mood disorders, discussed treatment and gave resources for mental health follow up if concerns arise.  CSW recommends self-evaluation during the postpartum time period using the New Mom Checklist from Postpartum Progress and encouraged MOB to contact a medical professional if symptoms are noted at any time.   CSW provided review of Sudden Infant Death Syndrome (SIDS) precautions.   CSW identifies no further need for intervention and no barriers to discharge at this time.   Holiday Mcmenamin S. Kyros Salzwedel, MSW, LCSW [facility] [phone]  

## 2020-05-02 ENCOUNTER — Other Ambulatory Visit (HOSPITAL_COMMUNITY): Admission: RE | Admit: 2020-05-02 | Payer: BC Managed Care – PPO | Source: Ambulatory Visit

## 2020-05-02 LAB — GLUCOSE, CAPILLARY
Glucose-Capillary: 105 mg/dL — ABNORMAL HIGH (ref 70–99)
Glucose-Capillary: 108 mg/dL — ABNORMAL HIGH (ref 70–99)
Glucose-Capillary: 110 mg/dL — ABNORMAL HIGH (ref 70–99)

## 2020-05-02 MED ORDER — CYCLOBENZAPRINE HCL 5 MG PO TABS
10.0000 mg | ORAL_TABLET | Freq: Three times a day (TID) | ORAL | Status: DC | PRN
  Administered 2020-05-02 – 2020-05-03 (×2): 10 mg via ORAL
  Filled 2020-05-02 (×3): qty 2

## 2020-05-02 NOTE — Consult Note (Signed)
Neonatology Note:   Attendance at C-section:    I was asked by Dr. Elon Spanner to attend this repeat C/S at term after brief trial of labor. The mother is a G3P1011, GBS neg with good prenatal care complicated by LGA status, maternal PCOS and GDM on metformin and recently glyburide, GHTN life-style controlled, obesity, and anxiety/depression on Zoloft. ROM 18h 28m prior to delivery, fluid clear. Vacuum assist. Infant vigorous with good spontaneous cry and tone. +60 sec DCC.  Needed minimal bulb suctioning. Molding with cephalohematoma noted.  Clavicles intact. Lungs clear to ausc in DR. Apgars 8/9.  Father introduced to baby and family updated. To CN to care of Pediatrician.  Tina Kid Leary Roca, MD

## 2020-05-02 NOTE — Progress Notes (Signed)
Subjective: Postpartum Day 2: Cesarean Delivery Patient reports tolerating PO and no problems voiding.  Desires circ.  Objective: Vital signs in last 24 hours: Temp:  [97.8 F (36.6 C)-98.2 F (36.8 C)] 97.8 F (36.6 C) (08/04 0500) Pulse Rate:  [63-79] 79 (08/04 0500) Resp:  [18] 18 (08/04 0500) BP: (101-119)/(45-62) 105/55 (08/04 0500) SpO2:  [95 %-99 %] 99 % (08/04 0500)  Physical Exam:  General: alert, cooperative and appears stated age Lochia: appropriate Uterine Fundus: firm Incision: wound vac in place and functioning DVT Evaluation: No evidence of DVT seen on physical exam. Negative Homan's sign. No cords or calf tenderness.  Recent Labs    04/30/20 2155 05/01/20 0450  HGB 10.6* 10.0*  HCT 33.2* 31.1*    Assessment/Plan: Status post Cesarean section. Doing well postoperatively.  Continue current care. Circ-patient counseled re: risk of bleeding, infection, scarring.  All questions were answered and patient wishes to proceed. DM-Metformin.  Good control CHTN-well controlled on no medication  Mitchel Honour 05/02/2020, 9:03 AM

## 2020-05-03 ENCOUNTER — Inpatient Hospital Stay (HOSPITAL_COMMUNITY)
Admission: AD | Admit: 2020-05-03 | Payer: BC Managed Care – PPO | Source: Home / Self Care | Admitting: Obstetrics and Gynecology

## 2020-05-03 ENCOUNTER — Inpatient Hospital Stay (HOSPITAL_COMMUNITY): Payer: BC Managed Care – PPO

## 2020-05-03 ENCOUNTER — Encounter (HOSPITAL_COMMUNITY): Payer: Self-pay | Admitting: Obstetrics and Gynecology

## 2020-05-03 MED ORDER — OXYCODONE HCL 5 MG PO TABS: 5.0000 mg | ORAL_TABLET | ORAL | 0 refills | Status: DC | PRN

## 2020-05-03 MED ORDER — IBUPROFEN 600 MG PO TABS: 600.0000 mg | ORAL_TABLET | Freq: Four times a day (QID) | ORAL | 0 refills | Status: AC | PRN

## 2020-05-03 MED ORDER — DOCUSATE SODIUM 100 MG PO CAPS
100.0000 mg | ORAL_CAPSULE | Freq: Two times a day (BID) | ORAL | 2 refills | Status: AC
Start: 2020-05-03 — End: ?

## 2020-05-03 MED ORDER — CYCLOBENZAPRINE HCL 10 MG PO TABS: 10.0000 mg | ORAL_TABLET | Freq: Three times a day (TID) | ORAL | 0 refills | Status: AC | PRN

## 2020-05-03 NOTE — Lactation Note (Signed)
This note was copied from a baby's chart. Lactation Consultation Note  Patient Name: Boy Antoria Lanza [id number]'[id number] Date: 05/03/2020 Reason for consult: Follow-up assessment   Infant is 52 hours old. At 37+4 weeks at 7% wt loss.  Mother eating breakfast at this time.   Mother reports that pumping is going better. She is bottle feeding ebm and formula.  Mother reports that she would like for [facility] to see latch when infant breastfeeds next. She will page for [facility] to come back.   Staff nurse reports that mother pumped 15 ml form the Rt breast and was pumping the Lt now.  Mother to page if needed.     Maternal Data    Feeding    LATCH Score                   Interventions    Lactation Tools Discussed/Used     Consult Status Consult Status: Follow-up Follow-up type: In-patient    Stevan Born [facility] 05/03/2020, 10:06 AM

## 2020-05-03 NOTE — Discharge Summary (Signed)
Postpartum Discharge Summary  Date of Service updated 05/03/20     Patient Name: Tina Diaz DOB: 1980/10/27 MRN: [id number]  Date of admission: 04/30/2020 Delivery date:04/30/2020  Delivering provider: Tyson Dense  Date of discharge: 05/03/2020  Admitting diagnosis: Pregnancy [Z34.90] Intrauterine pregnancy: [redacted]w[redacted]d    Secondary diagnosis:  Active Problems:   Pregnancy  Additional problems: Morbid obesity   Discharge diagnosis: Term Pregnancy Delivered, GDM A2 and failed VTOLAC                                              Post partum procedures:none Augmentation: AROM and Pitocin Complications: None  Hospital course: Onset of Labor With Unplanned C/S   39y.o. yo G3P1011 at 373w0das admitted in Latent Labor on 04/30/2020. Patient had a labor course significant for arrest of dilation and nrFHT. The patient went for cesarean section due to Arrest of Dilation, Non-Reassuring FHR and previous CS. Delivery details as follows: Membrane Rupture Time/Date: 1:40 AM ,04/30/2020   Delivery Method:C-Section, Vacuum Assisted  Details of operation can be found in separate operative note. Patient had an uncomplicated postpartum course.  She is ambulating,tolerating a regular diet, passing flatus, and urinating well.  Patient is discharged home in stable condition 05/03/20.  Newborn Data: Birth date:04/30/2020  Birth time:8:08 PM  Gender:Female  Living status:Living  Apgars:8 ,9  Weight:3915 g   Magnesium Sulfate received: No BMZ received: No Rhophylac:N/A MMR:No T-DaP:Given prenatally Flu: N/A Transfusion:No  Physical exam  Vitals:   05/02/20 0500 05/02/20 1421 05/02/20 2223 05/03/20 0530  BP: (!) 105/55 (!) 117/56 125/67 122/65  Pulse: 79 81 76 82  Resp: '18 16 18 18  ' Temp: 97.8 F (36.6 C) 98.3 F (36.8 C) 98.8 F (37.1 C) 98.5 F (36.9 C)  TempSrc: Oral Oral Oral Axillary  SpO2: 99% 99%  98%  Weight:      Height:       General: alert Lochia:  appropriate Uterine Fundus: firm Incision: Healing well with no significant drainage DVT Evaluation: No evidence of DVT seen on physical exam. Labs: Lab Results  Component Value Date   WBC 13.1 (H) 05/01/2020   HGB 10.0 (L) 05/01/2020   HCT 31.1 (L) 05/01/2020   MCV 89.4 05/01/2020   PLT 223 05/01/2020   CMP Latest Ref Rng & Units 04/03/2020  Glucose 70 - 99 mg/dL 142(H)  BUN 6 - 20 mg/dL 8  Creatinine 0.44 - 1.00 mg/dL 0.40(L)  Sodium 135 - 145 mmol/L 135  Potassium 3.5 - 5.1 mmol/L 3.8  Chloride 98 - 111 mmol/L 103  CO2 22 - 32 mmol/L -  Calcium 8.9 - 10.3 mg/dL -  Total Protein 6.5 - 8.1 g/dL 6.2(L)  Total Bilirubin 0.3 - 1.2 mg/dL 0.8  Alkaline Phos 38 - 126 U/L 105  AST 15 - 41 U/L 19  ALT 0 - 44 U/L 23   Edinburgh Score: Edinburgh Postnatal Depression Scale Screening Tool 05/01/2020  I have been able to laugh and see the funny side of things. 0  I have looked forward with enjoyment to things. 0  I have blamed myself unnecessarily when things went wrong. 2  I have been anxious or worried for no good reason. 2  I have felt scared or panicky for no good reason. 0  Things have been getting on top of me. 1  I have been so unhappy that I have had difficulty sleeping. 0  I have felt sad or miserable. 0  I have been so unhappy that I have been crying. 0  The thought of harming myself has occurred to me. 0  Edinburgh Postnatal Depression Scale Total 5      After visit meds:  Allergies as of 05/03/2020      Reactions   Augmentin [amoxicillin-pot Clavulanate] Nausea And Vomiting   Abdominal pain Has patient had a PCN reaction causing immediate rash, facial/tongue/throat swelling, SOB or lightheadedness with hypotension: No Has patient had a PCN reaction causing severe rash involving mucus membranes or skin necrosis: No Has patient had a PCN reaction that required hospitalization: No Has patient had a PCN reaction occurring within the last 10 years: No If all of the above  answers are "NO", then may proceed with Cephalosporin use.      Medication List    STOP taking these medications   aspirin EC 81 MG tablet   glyBURIDE 2.5 MG tablet Commonly known as: DIABETA   guaiFENesin 600 MG 12 hr tablet Commonly known as: MUCINEX     TAKE these medications   acetaminophen 325 MG tablet Commonly known as: Tylenol Take 2 tablets (650 mg total) by mouth every 6 (six) hours as needed for mild pain (for pain scale < 4).   albuterol 108 (90 Base) MCG/ACT inhaler Commonly known as: VENTOLIN HFA Inhale 2 puffs into the lungs every 4 (four) hours as needed for wheezing or shortness of breath.   cetirizine 10 MG tablet Commonly known as: ZyrTEC Allergy Take 1 tablet (10 mg total) by mouth daily. What changed: when to take this   cyclobenzaprine 10 MG tablet Commonly known as: FLEXERIL Take 1 tablet (10 mg total) by mouth 3 (three) times daily as needed for muscle spasms.   docusate sodium 100 MG capsule Commonly known as: Colace Take 1 capsule (100 mg total) by mouth 2 (two) times daily.   famotidine 20 MG tablet Commonly known as: PEPCID Take 20 mg by mouth at bedtime.   fluticasone 50 MCG/ACT nasal spray Commonly known as: FLONASE Place 2 sprays into both nostrils daily. What changed:   when to take this  reasons to take this   ibuprofen 600 MG tablet Commonly known as: ADVIL Take 1 tablet (600 mg total) by mouth every 6 (six) hours as needed.   metFORMIN 500 MG tablet Commonly known as: GLUCOPHAGE Take 500-1,000 mg by mouth See admin instructions. Tale 500 mg in the morning and 1000 mg at bedtime   One-A-Day Womens Prenatal 28-0.8 & 440 MG Misc Take 1 tablet by mouth every evening.   oxyCODONE 5 MG immediate release tablet Commonly known as: Oxy IR/ROXICODONE Take 1 tablet (5 mg total) by mouth every 4 (four) hours as needed for severe pain.   sertraline 100 MG tablet Commonly known as: ZOLOFT Take 100 mg by mouth every evening.         Discharge home in stable condition Infant Feeding: Breast Infant Disposition:home with mother Discharge instruction: per After Visit Summary and Postpartum booklet. Activity: Advance as tolerated. Pelvic rest for 6 weeks.  Diet: carb modified diet Anticipated Birth Control: Unsure Postpartum Appointment:1 week Additional Postpartum F/U: Incision check wound vac Future Appointments:No future appointments. Follow up Visit:      05/03/2020 Tyson Dense, MD

## 2020-05-15 ENCOUNTER — Encounter (HOSPITAL_COMMUNITY): Payer: Self-pay | Admitting: Obstetrics and Gynecology

## 2020-05-15 NOTE — Addendum Note (Signed)
Addendum  created 05/15/20 1501 by Trevor Iha, MD   Intraprocedure Event edited

## 2020-05-20 IMAGING — MG DIGITAL DIAGNOSTIC BILATERAL MAMMOGRAM WITH TOMO AND CAD
6 of 10 series · 6 of 30 positions shown · non-contrast
Comparison: 12/28/2013.

CLINICAL DATA: Patient presents with a palpable lump along the
lateral aspect of the left breast. She has noticed this
approximately 2 months. She discontinued breast-feeding 5 months
ago.

EXAM:
DIGITAL DIAGNOSTIC BILATERAL MAMMOGRAM WITH CAD AND TOMO
ULTRASOUND LEFT BREAST

[R MLO synth-2D]
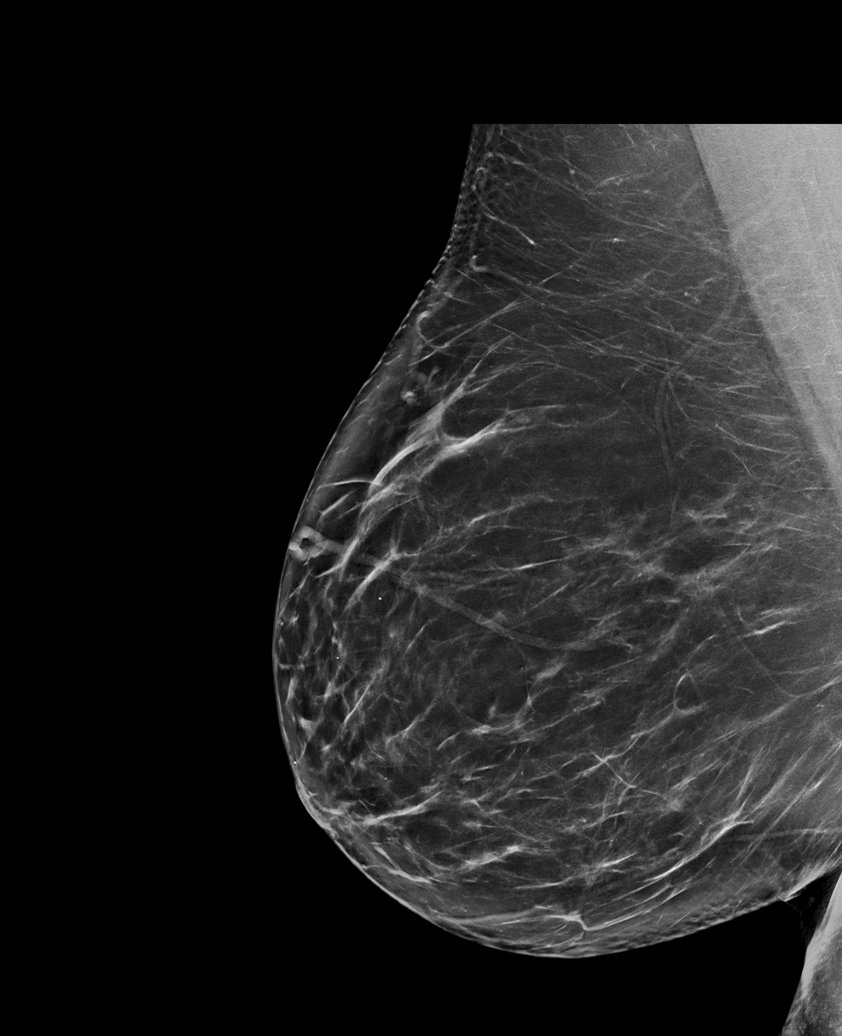

[L MLO synth-2D]
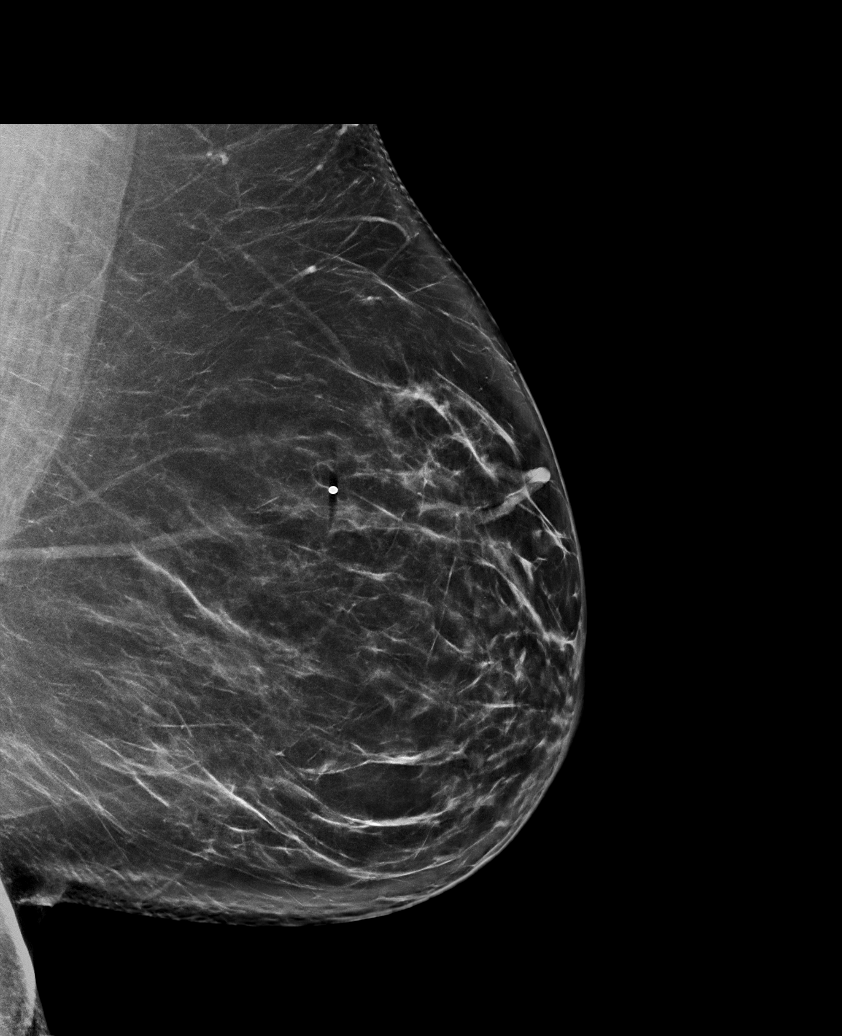

[L CC synth-2D (1 of 2)]
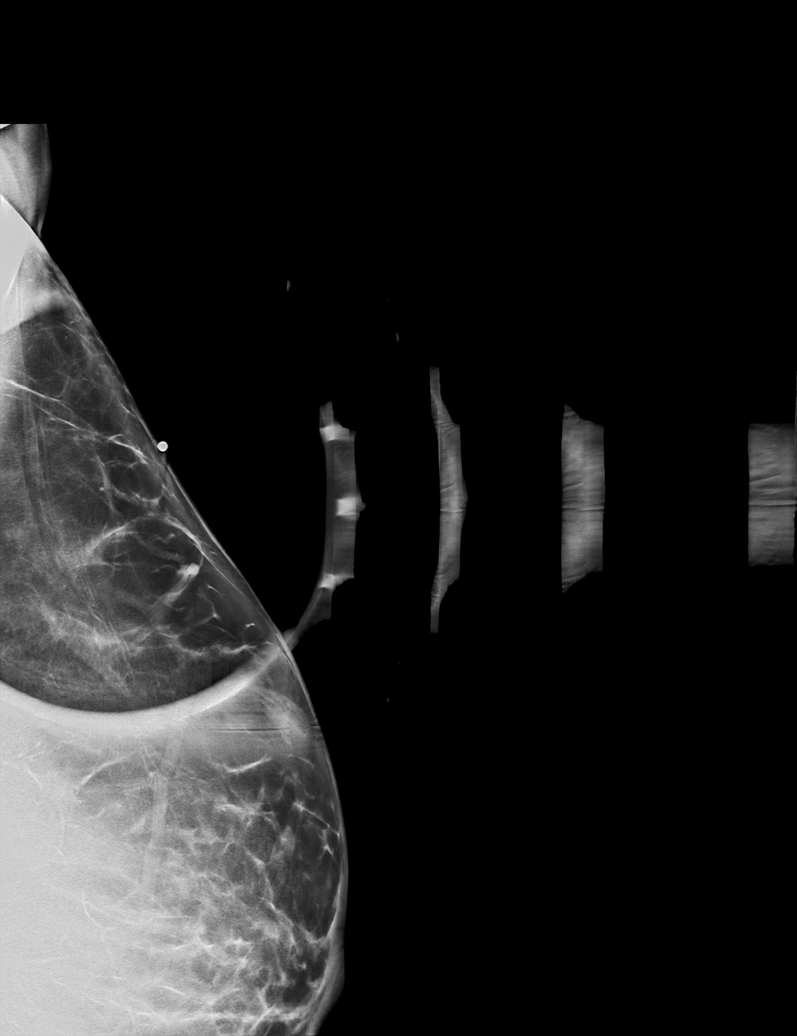

[R CC synth-2D]
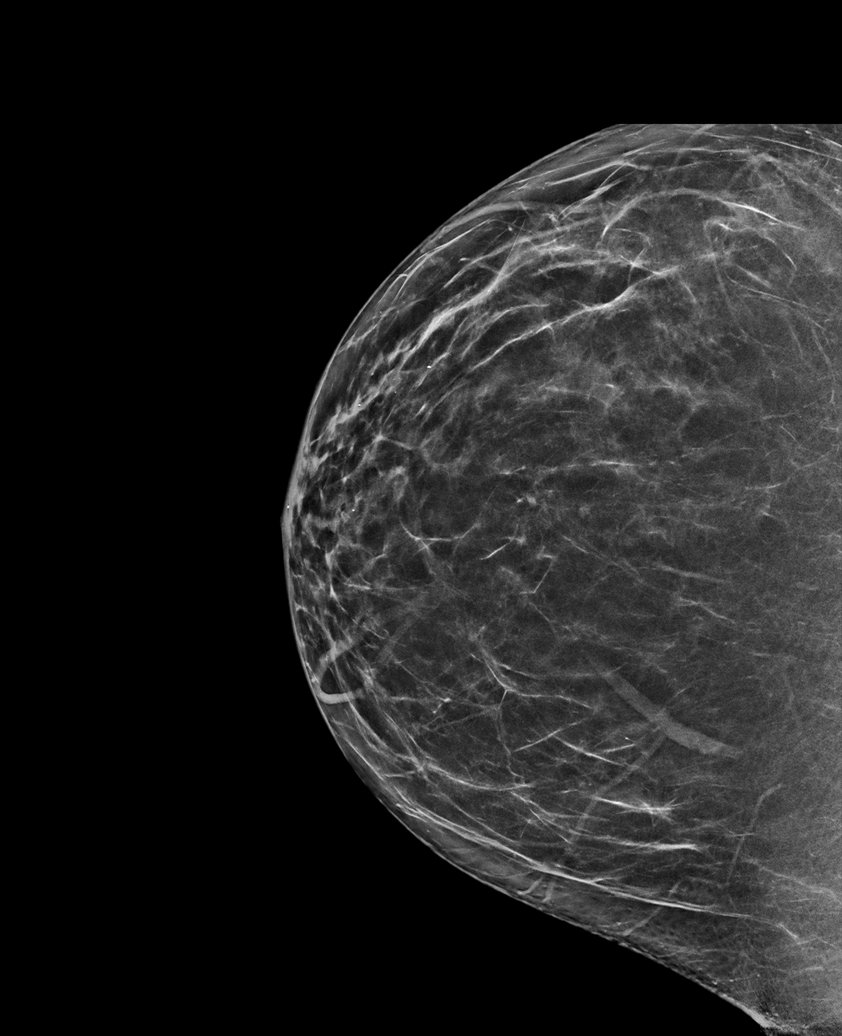

[L CC synth-2D (2 of 2)]
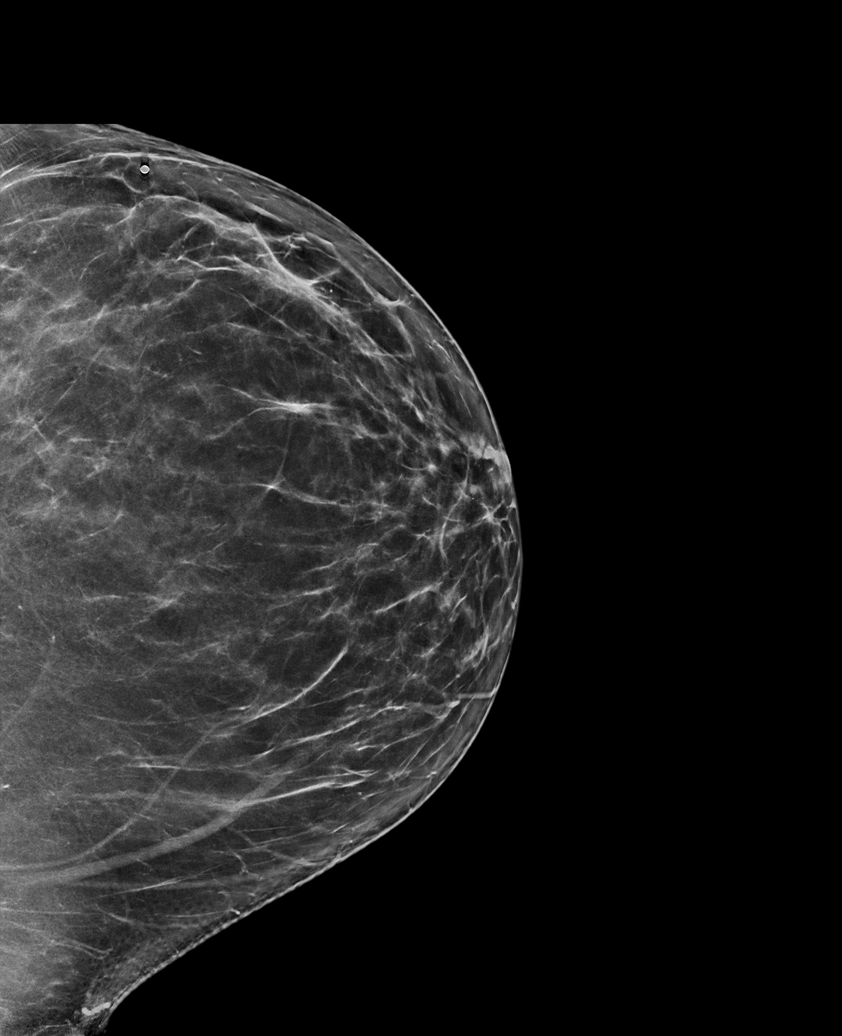

[L CC tomo · tomo slice 33/65.0]
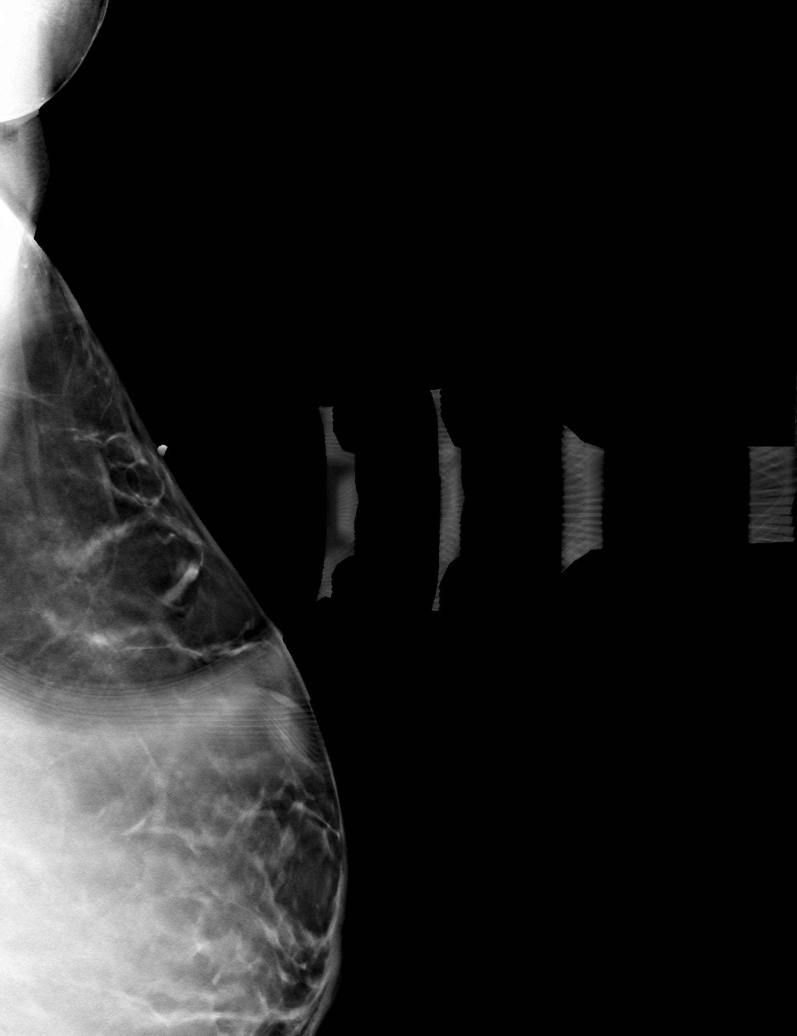

[6 of 30 positions shown; findings below may reference images not displayed]

ACR Breast Density Category b: There are scattered areas of
fibroglandular density.
FINDINGS: In the lateral aspect of the left breast, there are several small
fatty attenuation masses with thin walls. There are no other masses,
no areas of architectural distortion and no suspicious
calcifications. Current palpable abnormality is in a similar
location to a palpable abnormality evaluated in December 2013.

Mammographic images were processed with CAD.

On physical exam, there is a small superficial smooth nodule or
nodules in the left breast, upper outer quadrant, near 2 o'clock.

Targeted ultrasound is performed, showing a superficial complex
cystic lesion at 2 o'clock, 8 cm the nipple, measuring 1.7 cm in
longest axis and 1.5 x 0.5 x 1.3 cm radially and anti radially. This
corresponds to the palpable abnormality.
IMPRESSION: 1. Probably benign complex cystic mass consistent with fat necrosis
in the left breast at 2 o'clock. Short-term follow-up recommended.

RECOMMENDATION:
Left breast ultrasound in 6 months.

I have discussed the findings and recommendations with the patient.
Results were also provided in writing at the conclusion of the
visit. If applicable, a reminder letter will be sent to the patient
regarding the next appointment.

BI-RADS CATEGORY  3: Probably benign.

## 2020-09-20 ENCOUNTER — Telehealth: Payer: BC Managed Care – PPO | Admitting: Physician Assistant

## 2020-09-20 DIAGNOSIS — J329 Chronic sinusitis, unspecified: Secondary | ICD-10-CM | POA: Diagnosis not present

## 2020-09-20 MED ORDER — AMOXICILLIN 500 MG PO CAPS
500.0000 mg | ORAL_CAPSULE | Freq: Three times a day (TID) | ORAL | 0 refills | Status: AC
Start: 2020-09-20 — End: 2020-09-27

## 2020-09-20 NOTE — Progress Notes (Signed)
We are sorry that you are not feeling well.  Here is how we plan to help!  Based on what you have shared with me it looks like you have sinusitis.  Sinusitis is inflammation and infection in the sinus cavities of the head.  Based on your presentation I believe you most likely have Acute Bacterial Sinusitis.  This is an infection caused by bacteria and is treated with antibiotics. I have prescribed Amoxicillin 500mg  three times daily for 7 days.   I would recommend waiting to take this antibiotic for another 2-3 days and continue your treatment with over-the-counter options, like saline nasal rinses.  Sinusitis often resolves on it's own after about 10 days and antibiotics are present in breast milk.  In general, antibiotics that are present in breast milk may cause non-dose-related modification of bowel flora. Monitor infants for GI disturbances, such as thrush or diarrhea. Amoxicillin is considered safe during breastfeeding.     You may use an oral decongestant such as Mucinex D or if you have glaucoma or high blood pressure use plain Mucinex. Saline nasal spray help and can safely be used as often as needed for congestion.  If you develop worsening sinus pain, fever or notice severe headache and vision changes, or if symptoms are not better after completion of antibiotic, please schedule an appointment with a health care provider.    Sinus infections are not as easily transmitted as other respiratory infection, however we still recommend that you avoid close contact with loved ones, especially the very young and elderly.  Remember to wash your hands thoroughly throughout the day as this is the number one way to prevent the spread of infection!  Home Care:  Only take medications as instructed by your medical team.  Complete the entire course of an antibiotic.  Do not take these medications with alcohol.  A steam or ultrasonic humidifier can help congestion.  You can place a towel over your head  and breathe in the steam from hot water coming from a faucet.  Avoid close contacts especially the very young and the elderly.  Cover your mouth when you cough or sneeze.  Always remember to wash your hands.  Get Help Right Away If:  You develop worsening fever or sinus pain.  You develop a severe head ache or visual changes.  Your symptoms persist after you have completed your treatment plan.  Make sure you  Understand these instructions.  Will watch your condition.  Will get help right away if you are not doing well or get worse.  Your e-visit answers were reviewed by a board certified advanced clinical practitioner to complete your personal care plan.  Depending on the condition, your plan could have included both over the counter or prescription medications.  If there is a problem please reply  once you have received a response from your provider.  Your safety is important to .  If you have drug allergies check your prescription carefully.    You can use MyChart to ask questions about today's visit, request a non-urgent call back, or ask for a work or school excuse for 24 hours related to this e-Visit. If it has been greater than 24 hours you will need to follow up with your provider, or enter a new e-Visit to address those concerns.  You will get an e-mail in the next two days asking about your experience.  I hope that your e-visit has been valuable and will speed your recovery. Thank you  for using e-visits.   Greater than 5 minutes, yet less than 10 minutes of time have been spent researching, coordinating and implementing care for this patient today.

## 2020-11-27 IMAGING — US US BREAST*L* LIMITED INC AXILLA
1 series · 13 of 13 positions shown · non-contrast
Comparison: 03/07/2019, 12/28/2013

CLINICAL DATA: Patient returns for first six-month follow-up of the
LEFT breast following diagnostic mammogram and ultrasound performed
03/07/2019 for a palpable mass. Patient is newly pregnant with LMP
08/11/2019.

EXAM:
ULTRASOUND OF THE LEFT BREAST

[Series 1: us breast*left* limited inc axilla · 0.05mm/px · 13 of 13 slices shown]
[im 1/13]
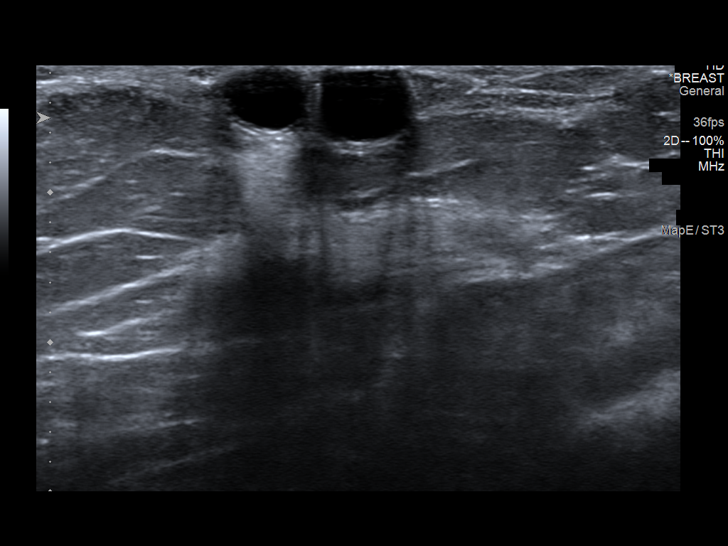
[im 2/13]
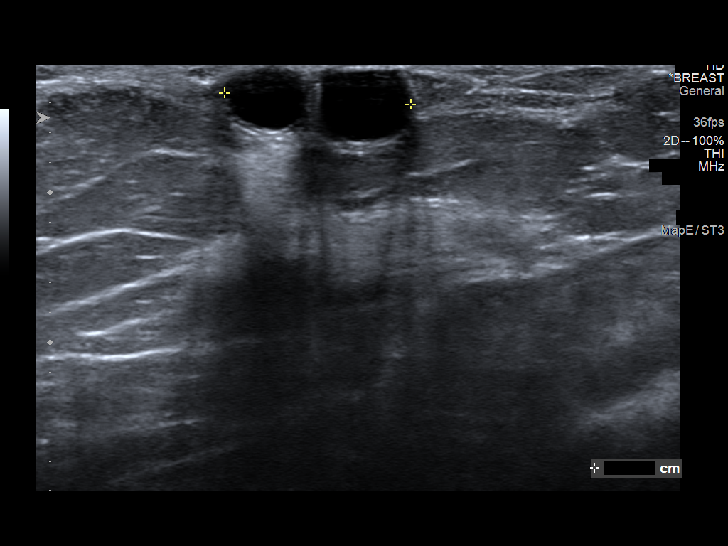
[im 3/13]
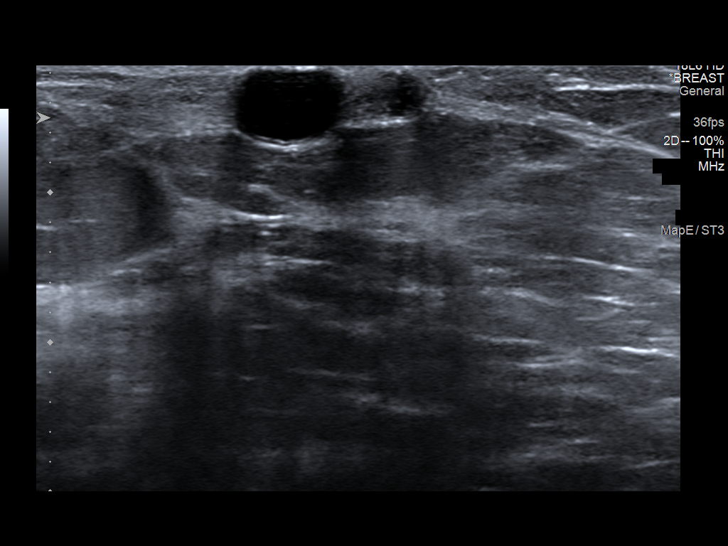
[im 4/13]
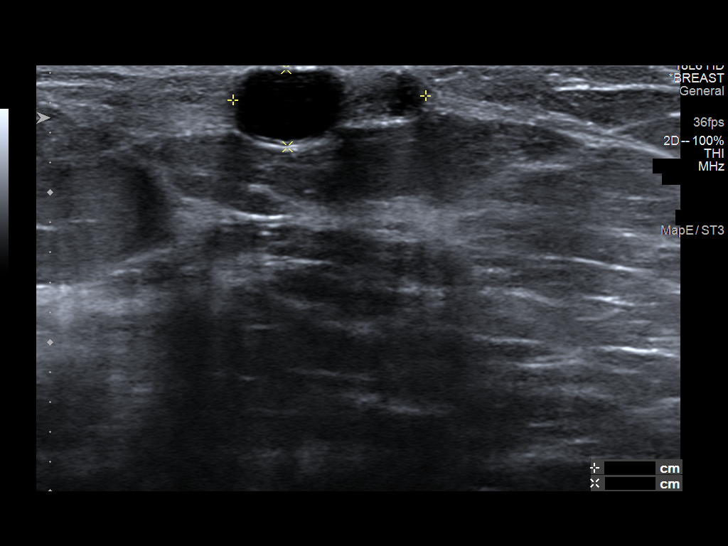
[im 5/13]
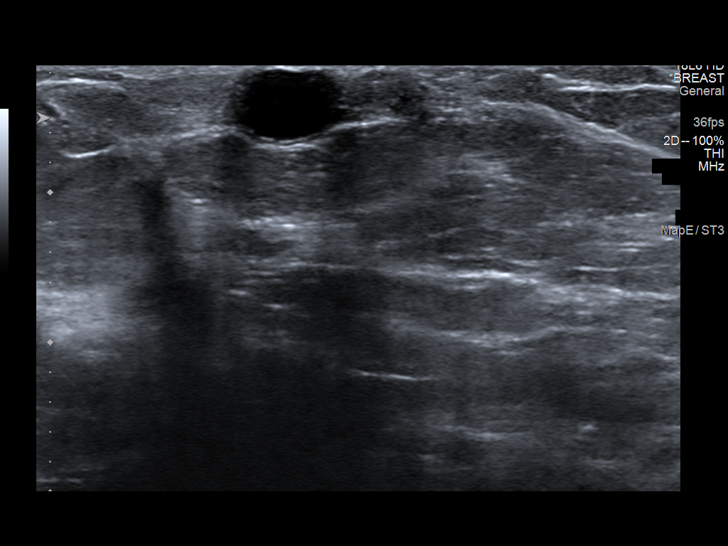
[im 6/13]
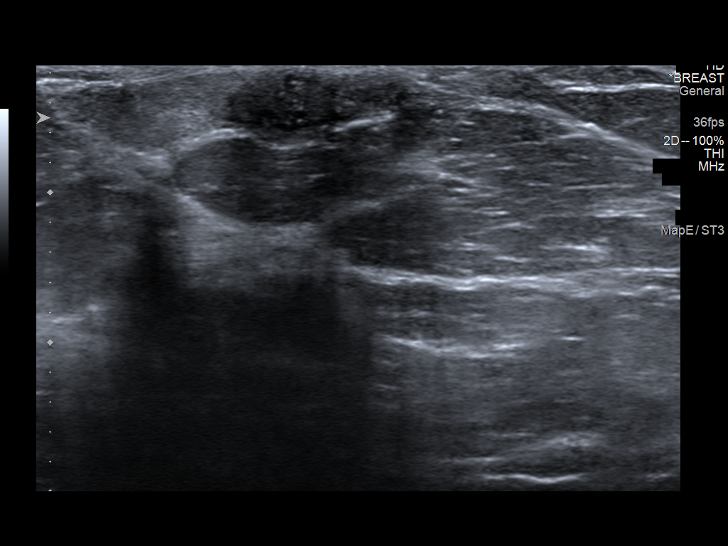
[im 7/13]
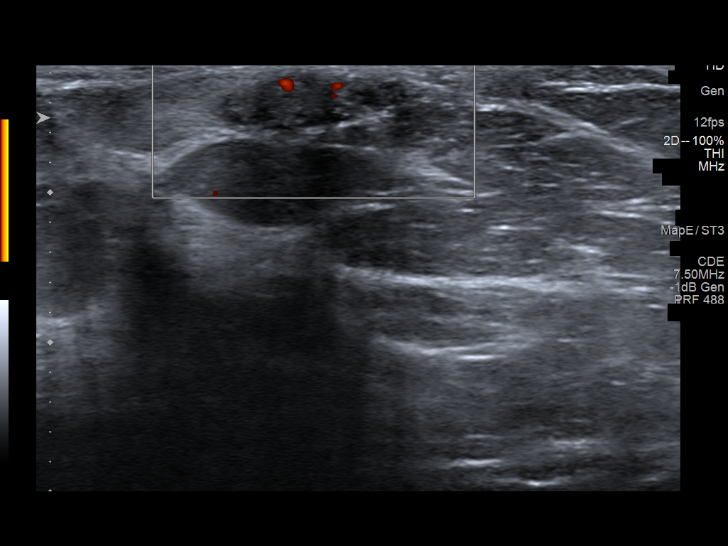
[im 8/13]
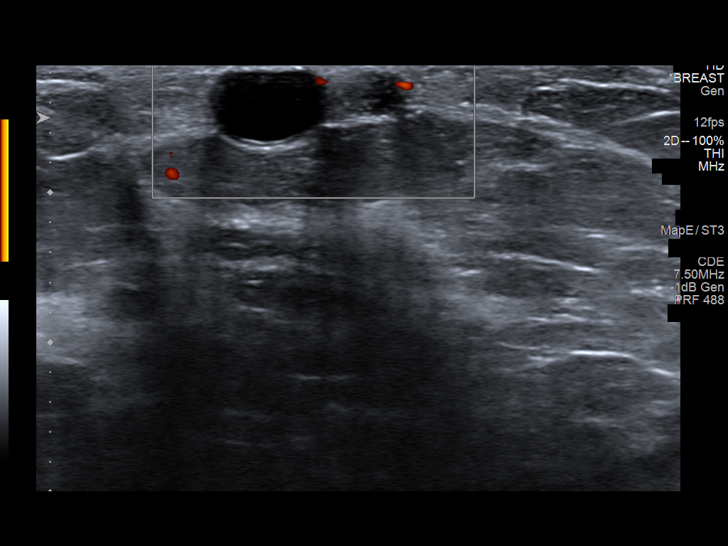
[im 9/13]
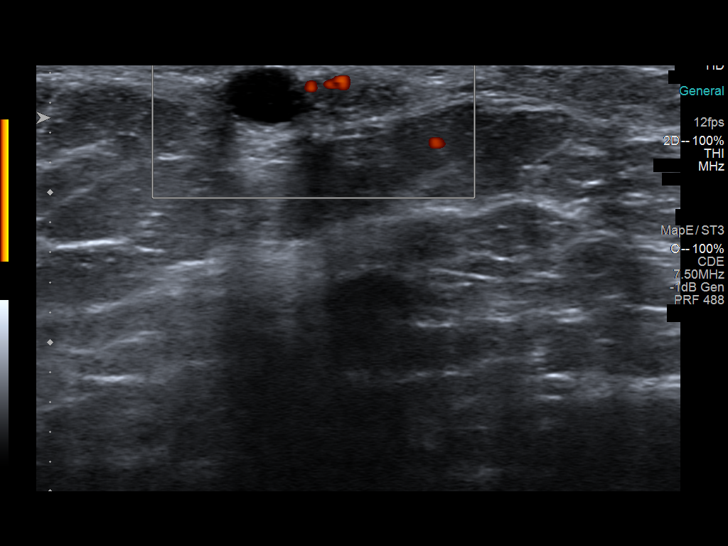
[im 10/13]
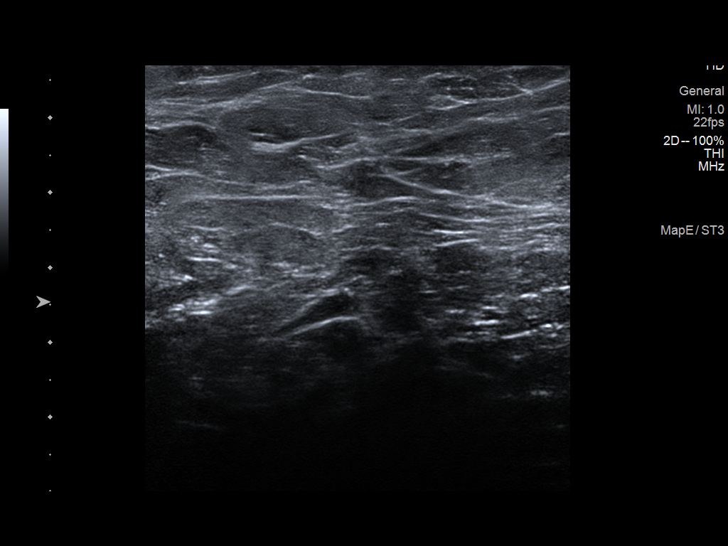
[im 11/13]
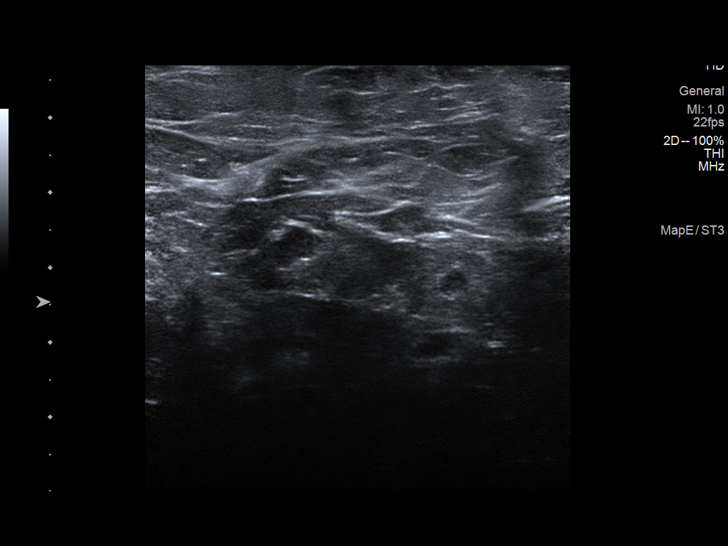
[im 12/13]
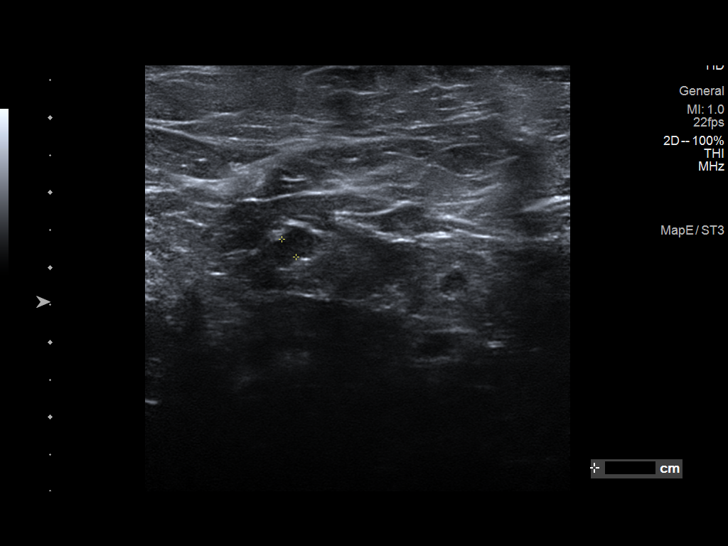
[im 13/13]
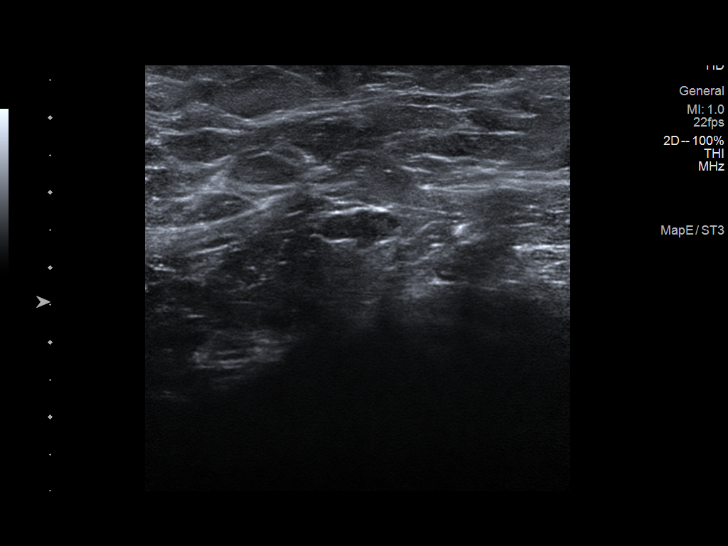

[13 of 13 positions shown; findings below may reference images not displayed]

FINDINGS: On physical exam, I palpate a discrete firm mass in the 2 o'clock
location of the LEFT breast 8 centimeters from the nipple, best
palpated when the patient sits. There is associated dimpling at the
site of the mass. There is no erythema. The patient denies any known
trauma. She reports history of clogged ducts in the LATERAL portion
of the LEFT breast when she was breastfeeding. Patient stopped
breastfeeding at the end of 5440.

Targeted ultrasound is performed, showing a partially solid
partially cystic mass in the 2 o'clock location of the LEFT breast 8
centimeters from nipple which measures 1.3 x 1.3 x 0.5 centimeters.
Portions of the mass demonstrates internal blood flow on Doppler
evaluation. Evaluation of the LEFT axilla shows multiple similar
mildly prominent axillary lymph nodes without focal thickening.
IMPRESSION: Indeterminate solid and cystic mass in the 2 o'clock location of the
LEFT breast favored to represent galactocele. However given the
presence of internal blood flow on Doppler evaluation
ultrasound-guided core biopsy is recommended for definitive
diagnosis.

RECOMMENDATION:
Ultrasound-guided core biopsy LEFT breast scheduled for 09/15/2019

I have discussed the findings and recommendations with the patient.
If applicable, a reminder letter will be sent to the patient
regarding the next appointment.

BI-RADS CATEGORY  4: Suspicious.

## 2021-01-31 ENCOUNTER — Other Ambulatory Visit: Payer: Self-pay | Admitting: Family Medicine

## 2021-01-31 DIAGNOSIS — K439 Ventral hernia without obstruction or gangrene: Secondary | ICD-10-CM

## 2021-01-31 DIAGNOSIS — R1907 Generalized intra-abdominal and pelvic swelling, mass and lump: Secondary | ICD-10-CM

## 2021-02-01 ENCOUNTER — Ambulatory Visit
Admission: RE | Admit: 2021-02-01 | Discharge: 2021-02-01 | Disposition: A | Discharge status: Home / Self Care | Payer: BC Managed Care – PPO | Source: Ambulatory Visit | Attending: Family Medicine | Admitting: Family Medicine

## 2021-02-01 DIAGNOSIS — K439 Ventral hernia without obstruction or gangrene: Secondary | ICD-10-CM

## 2021-02-01 DIAGNOSIS — R1907 Generalized intra-abdominal and pelvic swelling, mass and lump: Secondary | ICD-10-CM

## 2021-02-07 ENCOUNTER — Other Ambulatory Visit: Payer: Self-pay | Admitting: Family Medicine

## 2021-02-07 DIAGNOSIS — R1900 Intra-abdominal and pelvic swelling, mass and lump, unspecified site: Secondary | ICD-10-CM

## 2021-02-22 ENCOUNTER — Other Ambulatory Visit: Payer: Self-pay | Admitting: Family Medicine

## 2021-02-22 ENCOUNTER — Ambulatory Visit
Admission: RE | Admit: 2021-02-22 | Discharge: 2021-02-22 | Disposition: A | Discharge status: Home / Self Care | Payer: BC Managed Care – PPO | Source: Ambulatory Visit | Attending: Family Medicine | Admitting: Family Medicine

## 2021-02-22 ENCOUNTER — Other Ambulatory Visit: Payer: Self-pay

## 2021-02-22 DIAGNOSIS — R1900 Intra-abdominal and pelvic swelling, mass and lump, unspecified site: Secondary | ICD-10-CM

## 2021-02-22 MED ORDER — IOPAMIDOL (ISOVUE-300) INJECTION 61%
125.0000 mL | Freq: Once | INTRAVENOUS | Status: AC | PRN
  Administered 2021-02-22: 125 mL via INTRAVENOUS

## 2021-04-08 ENCOUNTER — Ambulatory Visit: Payer: Self-pay | Admitting: Surgery

## 2021-04-08 NOTE — H&P (Signed)
REFERRING PHYSICIAN:  Lupita Raider, MD   PROVIDER:  Lissa Morales, MD   MRN: [id number] DOB: 1981/08/25 DATE OF ENCOUNTER: 04/08/2021   Subjective    Chief Complaint: Hernia (Ventral hernia)       History of Present Illness: Tina Diaz is a 40 y.o. female who is seen today as an office consultation at the request of Dr. Clelia Croft for evaluation of Hernia (Ventral hernia) .     This is a 40 year old female who is status post 2 C-sections.  The most recent was August 2021.  Since that time, even as the wound was healing, she felt that she had a bulge in this area.  This has become slightly larger and is causing some discomfort.  She denies any obstructive symptoms.  She does have some intermittent constipation.  She denies any nausea or vomiting.  She brought this to the attention of her PCP Dr. Clelia Croft who obtained a CT scan.  This revealed a lower anterior abdominal wall hernia containing small bowel without sign of obstruction.  The hernia defect measures approximately 4.1 cm.   The patient reports losing about 45 pounds using the keto diet. Review of Systems: A complete review of systems was obtained from the patient.  I have reviewed this information and discussed as appropriate with the patient.  See HPI as well for other ROS.   Review of Systems  Constitutional: Negative.   HENT: Negative.   Eyes: Negative.   Respiratory: Negative.   Cardiovascular: Negative.   Gastrointestinal: Positive for constipation.  Genitourinary: Negative.   Musculoskeletal: Negative.   Skin: Negative.   Neurological: Negative.   Endo/Heme/Allergies: Negative.   Psychiatric/Behavioral: Positive for depression.        Medical History: Past Medical History  History reviewed. No pertinent past medical history.        Patient Active Problem List  Diagnosis   Ventral incisional hernia      Past Surgical History       Past Surgical History:  Procedure Laterality Date   CESAREAN  SECTION       csection x 2            Allergies       Allergies  Allergen Reactions   Amoxicillin-Pot Clavulanate Nausea, Nausea And Vomiting and Vomiting      Abdominal pain Has patient had a PCN reaction causing immediate rash, facial/tongue/throat swelling, SOB or lightheadedness with hypotension: No Has patient had a PCN reaction causing severe rash involving mucus membranes or skin necrosis: No Has patient had a PCN reaction that required hospitalization: No Has patient had a PCN reaction occurring within the last 10 years: No If all of the above answers are "NO", then may proceed with Cephalosporin use.                Current Outpatient Medications on File Prior to Visit  Medication Sig Dispense Refill   dextroamphetamine-amphetamine (ADDERALL XR) 10 MG XR capsule TAKE 1 CAPSULE BY MOUTH EVERY DAY IN THE MORNING FOR 30 DAYS       sertraline (ZOLOFT) 100 MG tablet sertraline 100 mg tablet  TAKE 1 TABLET BY MOUTH TWICE A DAY        No current facility-administered medications on file prior to visit.      Family History       Family History  Problem Relation Age of Onset   Diabetes Mother     Myocardial Infarction (Heart attack) Mother  High blood pressure (Hypertension) Mother          Social History       Tobacco Use  Smoking Status Never Smoker  Smokeless Tobacco Never Used      Social History  Social History         Socioeconomic History   Marital status: Married  Tobacco Use   Smoking status: Never Smoker   Smokeless tobacco: Never Used  Building services engineer Use: Never used  Substance and Sexual Activity   Alcohol use: Yes      Alcohol/week: 2.0 standard drinks      Types: 2 Glasses of wine per week   Drug use: Never   Sexual activity: Yes        Objective:         Vitals:    04/08/21 1023  BP: 124/80  Pulse: 104  Temp: 36.8 C (98.2 F)  SpO2: 96%  Weight: (!) 149.5 kg (329 lb 9.6 oz)  Height: 180.3 cm (5\' 11" )  PainSc: 0-No  pain    Body mass index is 45.97 kg/m.   Physical Exam    Constitutional:  WDWN in NAD, conversant, no obvious deformities; lying in bed comfortably Eyes:  Pupils equal, round; sclera anicteric; moist conjunctiva; no lid lag HENT:  Oral mucosa moist; good dentition  Neck:  No masses palpated, trachea midline; no thyromegaly Lungs:  CTA bilaterally; normal respiratory effort Breasts:  symmetric, no nipple changes; no palpable masses or lymphadenopathy on either side CV:  Regular rate and rhythm; no murmurs; extremities well-perfused with no edema Abd:  +bowel sounds, soft, non-tender, no palpable organomegaly; no palpable hernias Musc:  Unable to assess gait; no apparent clubbing or cyanosis in extremities Lymphatic:  No palpable cervical or axillary lymphadenopathy Skin:  Warm, dry; no sign of jaundice Psychiatric - alert and oriented x 4; calm mood and affect     Labs, Imaging and Diagnostic Testing:   CLINICAL DATA:  Lower abdominal pain   EXAM: CT ABDOMEN AND PELVIS WITH CONTRAST   TECHNIQUE: Multidetector CT imaging of the abdomen and pelvis was performed using the standard protocol following bolus administration of intravenous contrast.   CONTRAST:  ISOVUE-300 IOPAMIDOL (ISOVUE-300) INJECTION 61%   COMPARISON:  None.   FINDINGS: Lower chest: No acute abnormality.   Hepatobiliary: No focal liver abnormality is seen. No gallstones, gallbladder wall thickening, or biliary dilatation.   Pancreas: Unremarkable. No pancreatic ductal dilatation or surrounding inflammatory changes.   Spleen: Normal in size without focal abnormality.   Adrenals/Urinary Tract: Adrenal glands are within normal limits. Kidneys demonstrate a normal enhancement pattern bilaterally. No renal calculi or obstructive changes are seen. Ureters are within normal limits. Bladder is decompressed.   Stomach/Bowel: Colon shows no obstructive or inflammatory changes. The appendix is within  normal limits. Small bowel shows no obstructive changes but the majority of the small bowel is herniated through an anterior abdominal wall defect which measures approximately 4.1 cm. Stomach is within normal limits.   Vascular/Lymphatic: No significant vascular findings are present. No enlarged abdominal or pelvic lymph nodes.   Reproductive: Uterus and bilateral adnexa are unremarkable. IUD is noted in place.   Other: No ascites is noted. Large anterior abdominal wall hernia is noted midline with multiple loops of small bowel within. No evidence of incarceration.   Musculoskeletal: No acute or significant osseous findings.   IMPRESSION: Anterior abdominal wall hernia with small bowel within. No incarceration is noted.   No other  focal abnormality is noted.     Electronically Signed   By: Alcide Clever M.D.   On: 02/24/2021 18:24   Assessment and Plan:  Diagnoses and all orders for this visit:   Ventral incisional hernia     We discussed the options for management of this hernia.  We discussed laparoscopic versus open repairs.  She has a relatively large defect with a very large hernia sac.  I recommend open repair which will allow [place] to close the fascial defect over the mesh and we can also attempt to either drain or close the resulting dead space in the subcutaneous tissues.  She is in agreement with this plan.   Open ventral hernia repair with mesh.The surgical procedure has been discussed with the patient.  Potential risks, benefits, alternative treatments, and expected outcomes have been explained.  All of the patient's questions at this time have been answered.  The likelihood of reaching the patient's treatment goal is good.  The patient understand the proposed surgical procedure and wishes to proceed.       Bricen Victory Delbert Harness, MD  04/08/2021 3:00 PM

## 2021-05-21 NOTE — Progress Notes (Signed)
Surgical Instructions    Your procedure is scheduled on Wednesday, August 31st, 2022.   Report to [facility] Main Entrance "A" at 05:30 A.M., then check in with the Admitting office.  Call this number if you have problems the morning of surgery:  [phone]   If you have any questions prior to your surgery date call [phone]: Open Monday-Friday 8am-4pm    Remember:  Do not eat after midnight the night before your surgery  You may drink clear liquids until 04:30 the morning of your surgery.   Clear liquids allowed are: Water, Non-Citrus Juices (without pulp), Carbonated Beverages, Clear Tea, Black Coffee with (NO MILK, CREAM OR POWDERED CREAMER), and Gatorade    Take these medicines the morning of surgery with A SIP OF WATER:  cetirizine (ZYRTEC ALLERGY)  If needed:  acetaminophen (TYLENOL)  albuterol (VENTOLIN HFA) - please, bring the inhaler with you the day of surgery cyclobenzaprine (FLEXERIL)  As of today, STOP taking any Aspirin (unless otherwise instructed by your surgeon) Aleve, Naproxen, Ibuprofen, Motrin, Advil, Goody's, BC's, all herbal medications, fish oil, and all vitamins.           Do not wear jewelry or makeup Do not wear lotions, powders, perfumes, or deodorant. Do not shave 48 hours prior to surgery.   Do not bring valuables to the hospital. DO Not wear nail polish, gel polish, artificial nails, or any other type of covering on natural nails including finger and toenails. If patients have artificial nails, gel coating, etc. that need to be removed by a nail salon please have this removed prior to surgery or surgery may need to be canceled/delayed if the surgeon/ anesthesia feels like the patient is unable to be adequately monitored.             [facility] is not responsible for any belongings or valuables.  Do NOT Smoke (Tobacco/Vaping) or drink Alcohol 24 hours prior to your procedure If you use a CPAP at night, you may bring all equipment for your  overnight stay.   Contacts, glasses, dentures or bridgework may not be worn into surgery, please bring cases for these belongings   For patients admitted to the hospital, discharge time will be determined by your treatment team.   Patients discharged the day of surgery will not be allowed to drive home, and someone needs to stay with them for 24 hours.  ONLY 1 SUPPORT PERSON MAY BE PRESENT WHILE YOU ARE IN SURGERY. IF YOU ARE TO BE ADMITTED ONCE YOU ARE IN YOUR ROOM YOU WILL BE ALLOWED TWO (2) VISITORS.  Minor children may have two parents present. Special consideration for safety and communication needs will be reviewed on a case by case basis.  Special instructions:    Oral Hygiene is also important to reduce your risk of infection.  Remember - BRUSH YOUR TEETH THE MORNING OF SURGERY WITH YOUR REGULAR TOOTHPASTE   [place]- Preparing For Surgery  Before surgery, you can play an important role. Because skin is not sterile, your skin needs to be as free of germs as possible. You can reduce the number of germs on your skin by washing with CHG (chlorahexidine gluconate) Soap before surgery.  CHG is an antiseptic cleaner which kills germs and bonds with the skin to continue killing germs even after washing.     Please do not use if you have an allergy to CHG or antibacterial soaps. If your skin becomes reddened/irritated stop using the CHG.  Do not  shave (including legs and underarms) for at least 48 hours prior to first CHG shower. It is OK to shave your face.  Please follow these instructions carefully.     Shower the NIGHT BEFORE SURGERY and the MORNING OF SURGERY with CHG Soap.   If you chose to wash your hair, wash your hair first as usual with your normal shampoo. After you shampoo, rinse your hair and body thoroughly to remove the shampoo.  Then [employer] and genitals (private parts) with your normal soap and rinse thoroughly to remove soap.  After that Use CHG Soap as you would  any other liquid soap. You can apply CHG directly to the skin and wash gently with a scrungie or a clean washcloth.   Apply the CHG Soap to your body ONLY FROM THE NECK DOWN.  Do not use on open wounds or open sores. Avoid contact with your eyes, ears, mouth and genitals (private parts). Wash Face and genitals (private parts)  with your normal soap.   Wash thoroughly, paying special attention to the area where your surgery will be performed.  Thoroughly rinse your body with warm water from the neck down.  DO NOT shower/wash with your normal soap after using and rinsing off the CHG Soap.  Pat yourself dry with a CLEAN TOWEL.  Wear CLEAN PAJAMAS to bed the night before surgery  Place CLEAN SHEETS on your bed the night before your surgery  DO NOT SLEEP WITH PETS.   Day of Surgery:  Take a shower with CHG soap. Wear Clean/Comfortable clothing the morning of surgery Do not apply any deodorants/lotions.   Remember to brush your teeth WITH YOUR REGULAR TOOTHPASTE.   Please read over the following fact sheets that you were given.

## 2021-05-22 ENCOUNTER — Other Ambulatory Visit: Payer: Self-pay

## 2021-05-22 ENCOUNTER — Encounter (HOSPITAL_COMMUNITY): Payer: Self-pay

## 2021-05-22 ENCOUNTER — Encounter (HOSPITAL_COMMUNITY)
Admission: RE | Admit: 2021-05-22 | Discharge: 2021-05-22 | Disposition: A | Discharge status: Home / Self Care | Payer: BC Managed Care – PPO | Source: Ambulatory Visit | Attending: Surgery | Admitting: Surgery

## 2021-05-22 DIAGNOSIS — Z01818 Encounter for other preprocedural examination: Secondary | ICD-10-CM | POA: Insufficient documentation

## 2021-05-22 LAB — CBC
HCT: 41.1 % (ref 36.0–46.0)
Hemoglobin: 13.5 g/dL (ref 12.0–15.0)
MCH: 28.1 pg (ref 26.0–34.0)
MCHC: 32.8 g/dL (ref 30.0–36.0)
MCV: 85.6 fL (ref 80.0–100.0)
Platelets: 237 10*3/uL (ref 150–400)
RBC: 4.8 MIL/uL (ref 3.87–5.11)
RDW: 13.8 % (ref 11.5–15.5)
WBC: 7.5 10*3/uL (ref 4.0–10.5)
nRBC: 0 % (ref 0.0–0.2)

## 2021-05-22 LAB — BASIC METABOLIC PANEL
Anion gap: 5 (ref 5–15)
BUN: 11 mg/dL (ref 6–20)
CO2: 28 mmol/L (ref 22–32)
Calcium: 9 mg/dL (ref 8.9–10.3)
Chloride: 105 mmol/L (ref 98–111)
Creatinine, Ser: 0.71 mg/dL (ref 0.44–1.00)
GFR, Estimated: >60 mL/min (ref >60)
Glucose, Bld: 112 mg/dL — ABNORMAL HIGH (ref 70–99)
Potassium: 4.4 mmol/L (ref 3.5–5.1)
Sodium: 138 mmol/L (ref 135–145)

## 2021-05-22 NOTE — Progress Notes (Signed)
PCP: Lupita Raider, MD Cardiologist: denies  EKG: 05/22/21 CXR: na ECHO: denies Stress Test: denies Cardiac Cath: denies  Fasting Blood Sugar- na Checks Blood Sugar__na_ times a day  OSA/CPAP: No  ASA/Blood Thinner: No  Covid test 05/27/21.  Gave instructions, map and requisition to patient  Anesthesia Review: No  Patient denies shortness of breath, fever, cough, and chest pain at PAT appointment.  Patient verbalized understanding of instructions provided today at the PAT appointment.  Patient asked to review instructions at home and day of surgery.

## 2021-05-27 ENCOUNTER — Other Ambulatory Visit: Payer: Self-pay | Admitting: Surgery

## 2021-05-28 LAB — SARS CORONAVIRUS 2 (TAT 6-24 HRS): SARS Coronavirus 2: NEGATIVE

## 2021-05-28 NOTE — H&P (Signed)
REFERRING PHYSICIAN:  Lupita Raider, MD   PROVIDER:  Lissa Morales, MD   MRN: [id number] DOB: 05-07-1981 DATE OF ENCOUNTER: 04/08/2021   Subjective    Chief Complaint: Hernia (Ventral hernia)       History of Present Illness: Tina Diaz is a 40 y.o. female who is seen today as an office consultation at the request of Dr. Clelia Croft for evaluation of Hernia (Ventral hernia) .     This is a 40 year old female who is status post 2 C-sections.  The most recent was August 2021.  Since that time, even as the wound was healing, she felt that she had a bulge in this area.  This has become slightly larger and is causing some discomfort.  She denies any obstructive symptoms.  She does have some intermittent constipation.  She denies any nausea or vomiting.  She brought this to the attention of her PCP Dr. Clelia Croft who obtained a CT scan.  This revealed a lower anterior abdominal wall hernia containing small bowel without sign of obstruction.  The hernia defect measures approximately 4.1 cm.   The patient reports losing about 45 pounds using the keto diet. Review of Systems: A complete review of systems was obtained from the patient.  I have reviewed this information and discussed as appropriate with the patient.  See HPI as well for other ROS.   Review of Systems  Constitutional: Negative.   HENT: Negative.   Eyes: Negative.   Respiratory: Negative.   Cardiovascular: Negative.   Gastrointestinal: Positive for constipation.  Genitourinary: Negative.   Musculoskeletal: Negative.   Skin: Negative.   Neurological: Negative.   Endo/Heme/Allergies: Negative.   Psychiatric/Behavioral: Positive for depression.        Medical History: Past Medical History  History reviewed. No pertinent past medical history.           Patient Active Problem List  Diagnosis   Ventral incisional hernia      Past Surgical History           Past Surgical History:  Procedure Laterality Date    CESAREAN SECTION       csection x 2            Allergies           Allergies  Allergen Reactions   Amoxicillin-Pot Clavulanate Nausea, Nausea And Vomiting and Vomiting      Abdominal pain Has patient had a PCN reaction causing immediate rash, facial/tongue/throat swelling, SOB or lightheadedness with hypotension: No Has patient had a PCN reaction causing severe rash involving mucus membranes or skin necrosis: No Has patient had a PCN reaction that required hospitalization: No Has patient had a PCN reaction occurring within the last 10 years: No If all of the above answers are "NO", then may proceed with Cephalosporin use.                     Current Outpatient Medications on File Prior to Visit  Medication Sig Dispense Refill   dextroamphetamine-amphetamine (ADDERALL XR) 10 MG XR capsule TAKE 1 CAPSULE BY MOUTH EVERY DAY IN THE MORNING FOR 30 DAYS       sertraline (ZOLOFT) 100 MG tablet sertraline 100 mg tablet  TAKE 1 TABLET BY MOUTH TWICE A DAY        No current facility-administered medications on file prior to visit.      Family History           Family History  Problem Relation  Age of Onset   Diabetes Mother     Myocardial Infarction (Heart attack) Mother     High blood pressure (Hypertension) Mother          Social History         Tobacco Use  Smoking Status Never Smoker  Smokeless Tobacco Never Used      Social History  Social History             Socioeconomic History   Marital status: Married  Tobacco Use   Smoking status: Never Smoker   Smokeless tobacco: Never Used  Building services engineer Use: Never used  Substance and Sexual Activity   Alcohol use: Yes      Alcohol/week: 2.0 standard drinks      Types: 2 Glasses of wine per week   Drug use: Never   Sexual activity: Yes        Objective:           Vitals:    04/08/21 1023  BP: 124/80  Pulse: 104  Temp: 36.8 C (98.2 F)  SpO2: 96%  Weight: (!) 149.5 kg (329 lb 9.6 oz)  Height:  180.3 cm (5\' 11" )  PainSc: 0-No pain    Body mass index is 45.97 kg/m.   Physical Exam    Constitutional:  WDWN in NAD, conversant, no obvious deformities; lying in bed comfortably Eyes:  Pupils equal, round; sclera anicteric; moist conjunctiva; no lid lag HENT:  Oral mucosa moist; good dentition  Neck:  No masses palpated, trachea midline; no thyromegaly Lungs:  CTA bilaterally; normal respiratory effort Breasts:  symmetric, no nipple changes; no palpable masses or lymphadenopathy on either side CV:  Regular rate and rhythm; no murmurs; extremities well-perfused with no edema Abd:  +bowel sounds, soft, non-tender, no palpable organomegaly; no palpable hernias Musc:  Unable to assess gait; no apparent clubbing or cyanosis in extremities Lymphatic:  No palpable cervical or axillary lymphadenopathy Skin:  Warm, dry; no sign of jaundice Psychiatric - alert and oriented x 4; calm mood and affect     Labs, Imaging and Diagnostic Testing:   CLINICAL DATA:  Lower abdominal pain   EXAM: CT ABDOMEN AND PELVIS WITH CONTRAST   TECHNIQUE: Multidetector CT imaging of the abdomen and pelvis was performed using the standard protocol following bolus administration of intravenous contrast.   CONTRAST:  ISOVUE-300 IOPAMIDOL (ISOVUE-300) INJECTION 61%   COMPARISON:  None.   FINDINGS: Lower chest: No acute abnormality.   Hepatobiliary: No focal liver abnormality is seen. No gallstones, gallbladder wall thickening, or biliary dilatation.   Pancreas: Unremarkable. No pancreatic ductal dilatation or surrounding inflammatory changes.   Spleen: Normal in size without focal abnormality.   Adrenals/Urinary Tract: Adrenal glands are within normal limits. Kidneys demonstrate a normal enhancement pattern bilaterally. No renal calculi or obstructive changes are seen. Ureters are within normal limits. Bladder is decompressed.   Stomach/Bowel: Colon shows no obstructive or inflammatory  changes. The appendix is within normal limits. Small bowel shows no obstructive changes but the majority of the small bowel is herniated through an anterior abdominal wall defect which measures approximately 4.1 cm. Stomach is within normal limits.   Vascular/Lymphatic: No significant vascular findings are present. No enlarged abdominal or pelvic lymph nodes.   Reproductive: Uterus and bilateral adnexa are unremarkable. IUD is noted in place.   Other: No ascites is noted. Large anterior abdominal wall hernia is noted midline with multiple loops of small bowel within. No evidence of incarceration.  Musculoskeletal: No acute or significant osseous findings.   IMPRESSION: Anterior abdominal wall hernia with small bowel within. No incarceration is noted.   No other focal abnormality is noted.     Electronically Signed   By: Alcide Clever M.D.   On: 02/24/2021 18:24   Assessment and Plan:  Diagnoses and all orders for this visit:   Ventral incisional hernia     We discussed the options for management of this hernia.  We discussed laparoscopic versus open repairs.  She has a relatively large defect with a very large hernia sac.  I recommend open repair which will allow [place] to close the fascial defect over the mesh and we can also attempt to either drain or close the resulting dead space in the subcutaneous tissues.  She is in agreement with this plan.   Open ventral hernia repair with mesh.The surgical procedure has been discussed with the patient.  Potential risks, benefits, alternative treatments, and expected outcomes have been explained.  All of the patient's questions at this time have been answered.  The likelihood of reaching the patient's treatment goal is good.  The patient understand the proposed surgical procedure and wishes to proceed.    Wilmon Arms. Corliss Skains, MD, [facility]  General Surgery   05/28/2021 3:36 PM

## 2021-05-28 NOTE — Anesthesia Preprocedure Evaluation (Addendum)
Anesthesia Evaluation  Patient identified by MRN, date of birth, ID band Patient awake    Reviewed: Allergy & Precautions, NPO status , Patient's Chart, lab work & pertinent test results  History of Anesthesia Complications Negative for: history of anesthetic complications  Airway Mallampati: III  TM Distance: >3 FB Neck ROM: Full    Dental no notable dental hx. (+) Dental Advisory Given   Pulmonary neg pulmonary ROS,    Pulmonary exam normal        Cardiovascular hypertension, Normal cardiovascular exam     Neuro/Psych Anxiety Depression negative neurological ROS  negative psych ROS   GI/Hepatic negative GI ROS, Neg liver ROS,   Endo/Other  diabetes, GestationalMorbid obesity  Renal/GU negative Renal ROS  negative genitourinary   Musculoskeletal negative musculoskeletal ROS (+)   Abdominal (+) + obese,   Peds negative pediatric ROS (+)  Hematology negative hematology ROS (+)   Anesthesia Other Findings   Reproductive/Obstetrics negative OB ROS PCOS                            Anesthesia Physical  Anesthesia Plan  ASA: 3  Anesthesia Plan: General   Post-op Pain Management:  Regional for Post-op pain   Induction: Intravenous  PONV Risk Score and Plan: 4 or greater and Ondansetron, Scopolamine patch - Pre-op, Dexamethasone and Midazolam  Airway Management Planned: Oral ETT  Additional Equipment:   Intra-op Plan:   Post-operative Plan: Extubation in OR  Informed Consent: I have reviewed the patients History and Physical, chart, labs and discussed the procedure including the risks, benefits and alternatives for the proposed anesthesia with the patient or authorized representative who has indicated his/her understanding and acceptance.     Dental advisory given  Plan Discussed with: Anesthesiologist and CRNA  Anesthesia Plan Comments:       Anesthesia Quick  Evaluation

## 2021-05-29 ENCOUNTER — Observation Stay (HOSPITAL_COMMUNITY)
Admission: RE | Admit: 2021-05-29 | Discharge: 2021-05-30 | Disposition: A | Discharge status: Home / Self Care | Payer: BC Managed Care – PPO | Attending: Surgery | Admitting: Surgery

## 2021-05-29 ENCOUNTER — Encounter (HOSPITAL_COMMUNITY): Payer: Self-pay | Admitting: Surgery

## 2021-05-29 ENCOUNTER — Ambulatory Visit (HOSPITAL_COMMUNITY): Payer: BC Managed Care – PPO | Admitting: Anesthesiology

## 2021-05-29 ENCOUNTER — Other Ambulatory Visit: Payer: Self-pay

## 2021-05-29 ENCOUNTER — Encounter (HOSPITAL_COMMUNITY)
Admission: RE | Disposition: A | Discharge status: Home / Self Care | Payer: Self-pay | Source: Home / Self Care | Attending: Surgery

## 2021-05-29 DIAGNOSIS — K432 Incisional hernia without obstruction or gangrene: Secondary | ICD-10-CM | POA: Diagnosis present

## 2021-05-29 DIAGNOSIS — K439 Ventral hernia without obstruction or gangrene: Principal | ICD-10-CM | POA: Insufficient documentation

## 2021-05-29 HISTORY — PX: VENTRAL HERNIA REPAIR: SHX424

## 2021-05-29 LAB — POCT PREGNANCY, URINE: Preg Test, Ur: NEGATIVE

## 2021-05-29 LAB — CREATININE, SERUM
Creatinine, Ser: 0.73 mg/dL (ref 0.44–1.00)
GFR, Estimated: >60 mL/min (ref >60)

## 2021-05-29 LAB — CBC
HCT: 40.4 % (ref 36.0–46.0)
Hemoglobin: 13 g/dL (ref 12.0–15.0)
MCH: 27.5 pg (ref 26.0–34.0)
MCHC: 32.2 g/dL (ref 30.0–36.0)
MCV: 85.6 fL (ref 80.0–100.0)
Platelets: 229 10*3/uL (ref 150–400)
RBC: 4.72 MIL/uL (ref 3.87–5.11)
RDW: 13.8 % (ref 11.5–15.5)
WBC: 13.1 10*3/uL — ABNORMAL HIGH (ref 4.0–10.5)
nRBC: 0 % (ref 0.0–0.2)

## 2021-05-29 SURGERY — REPAIR, HERNIA, VENTRAL
Anesthesia: General | Site: Abdomen

## 2021-05-29 MED ORDER — ONDANSETRON HCL 4 MG/2ML IJ SOLN: 4.0000 mg | Freq: Four times a day (QID) | INTRAMUSCULAR | Status: DC | PRN

## 2021-05-29 MED ORDER — PROMETHAZINE HCL 25 MG/ML IJ SOLN: 6.2500 mg | INTRAMUSCULAR | Status: DC | PRN

## 2021-05-29 MED ORDER — 0.9 % SODIUM CHLORIDE (POUR BTL) OPTIME
TOPICAL | Status: DC | PRN
  Administered 2021-05-29: 1000 mL

## 2021-05-29 MED ORDER — ORAL CARE MOUTH RINSE: 15.0000 mL | Freq: Once | OROMUCOSAL | Status: AC

## 2021-05-29 MED ORDER — MORPHINE SULFATE (PF) 2 MG/ML IV SOLN: 2.0000 mg | INTRAVENOUS | Status: DC | PRN

## 2021-05-29 MED ORDER — BUPIVACAINE LIPOSOME 1.3 % IJ SUSP
INTRAMUSCULAR | Status: DC | PRN
  Administered 2021-05-29 (×2): 10 mL

## 2021-05-29 MED ORDER — FENTANYL CITRATE (PF) 250 MCG/5ML IJ SOLN
INTRAMUSCULAR | Status: DC | PRN
  Administered 2021-05-29 (×2): 50 ug via INTRAVENOUS
  Administered 2021-05-29: 100 ug via INTRAVENOUS
  Administered 2021-05-29 (×4): 50 ug via INTRAVENOUS

## 2021-05-29 MED ORDER — SERTRALINE HCL 100 MG PO TABS
100.0000 mg | ORAL_TABLET | Freq: Every evening | ORAL | Status: DC
  Administered 2021-05-29: 100 mg via ORAL
  Filled 2021-05-29: qty 1

## 2021-05-29 MED ORDER — FENTANYL CITRATE (PF) 250 MCG/5ML IJ SOLN
INTRAMUSCULAR | Status: AC
  Filled 2021-05-29: qty 5

## 2021-05-29 MED ORDER — ACETAMINOPHEN 325 MG PO TABS
650.0000 mg | ORAL_TABLET | Freq: Four times a day (QID) | ORAL | Status: DC | PRN
  Administered 2021-05-29: 650 mg via ORAL
  Filled 2021-05-29: qty 2

## 2021-05-29 MED ORDER — FENTANYL CITRATE (PF) 100 MCG/2ML IJ SOLN
25.0000 ug | INTRAMUSCULAR | Status: DC | PRN
  Administered 2021-05-29: 50 ug via INTRAVENOUS
  Administered 2021-05-29: 25 ug via INTRAVENOUS

## 2021-05-29 MED ORDER — DIPHENHYDRAMINE HCL 50 MG/ML IJ SOLN: 25.0000 mg | Freq: Four times a day (QID) | INTRAMUSCULAR | Status: DC | PRN

## 2021-05-29 MED ORDER — PROPOFOL 10 MG/ML IV BOLUS
INTRAVENOUS | Status: AC
  Filled 2021-05-29: qty 20

## 2021-05-29 MED ORDER — POTASSIUM CHLORIDE IN NACL 20-0.9 MEQ/L-% IV SOLN
INTRAVENOUS | Status: DC
  Filled 2021-05-29: qty 1000

## 2021-05-29 MED ORDER — METOPROLOL TARTRATE 5 MG/5ML IV SOLN
5.0000 mg | Freq: Four times a day (QID) | INTRAVENOUS | Status: DC | PRN
Start: 2021-05-29 — End: 2021-05-30

## 2021-05-29 MED ORDER — ENOXAPARIN SODIUM 40 MG/0.4ML IJ SOSY
40.0000 mg | PREFILLED_SYRINGE | INTRAMUSCULAR | Status: DC
  Administered 2021-05-30: 40 mg via SUBCUTANEOUS
  Filled 2021-05-29: qty 0.4

## 2021-05-29 MED ORDER — LIDOCAINE 2% (20 MG/ML) 5 ML SYRINGE
INTRAMUSCULAR | Status: AC
  Filled 2021-05-29: qty 5

## 2021-05-29 MED ORDER — ONDANSETRON 4 MG PO TBDP: 4.0000 mg | ORAL_TABLET | Freq: Four times a day (QID) | ORAL | Status: DC | PRN

## 2021-05-29 MED ORDER — CYCLOBENZAPRINE HCL 10 MG PO TABS
10.0000 mg | ORAL_TABLET | Freq: Three times a day (TID) | ORAL | Status: DC | PRN
  Administered 2021-05-30 (×2): 10 mg via ORAL
  Filled 2021-05-29: qty 2
  Filled 2021-05-29: qty 1

## 2021-05-29 MED ORDER — MIDAZOLAM HCL 2 MG/2ML IJ SOLN
INTRAMUSCULAR | Status: DC | PRN
  Administered 2021-05-29: 2 mg via INTRAVENOUS

## 2021-05-29 MED ORDER — CEFAZOLIN IN SODIUM CHLORIDE 3-0.9 GM/100ML-% IV SOLN
3.0000 g | INTRAVENOUS | Status: AC
  Administered 2021-05-29: 3 g via INTRAVENOUS
  Filled 2021-05-29: qty 100

## 2021-05-29 MED ORDER — PROPOFOL 10 MG/ML IV BOLUS
INTRAVENOUS | Status: DC | PRN
  Administered 2021-05-29: 200 mg via INTRAVENOUS

## 2021-05-29 MED ORDER — ALBUTEROL SULFATE (2.5 MG/3ML) 0.083% IN NEBU: 3.0000 mL | INHALATION_SOLUTION | RESPIRATORY_TRACT | Status: DC | PRN

## 2021-05-29 MED ORDER — ACETAMINOPHEN 500 MG PO TABS
1000.0000 mg | ORAL_TABLET | Freq: Once | ORAL | Status: AC
  Administered 2021-05-29: 1000 mg via ORAL
  Filled 2021-05-29: qty 2

## 2021-05-29 MED ORDER — CHLORHEXIDINE GLUCONATE 0.12 % MT SOLN: 15.0000 mL | Freq: Once | OROMUCOSAL | Status: AC

## 2021-05-29 MED ORDER — SUGAMMADEX SODIUM 200 MG/2ML IV SOLN
INTRAVENOUS | Status: DC | PRN
  Administered 2021-05-29: 300 mg via INTRAVENOUS

## 2021-05-29 MED ORDER — LORATADINE 10 MG PO TABS
10.0000 mg | ORAL_TABLET | Freq: Every day | ORAL | Status: DC
  Administered 2021-05-29 – 2021-05-30 (×2): 10 mg via ORAL
  Filled 2021-05-29 (×2): qty 1

## 2021-05-29 MED ORDER — DEXAMETHASONE SODIUM PHOSPHATE 10 MG/ML IJ SOLN
INTRAMUSCULAR | Status: DC | PRN
  Administered 2021-05-29: 10 mg via INTRAVENOUS

## 2021-05-29 MED ORDER — ROCURONIUM BROMIDE 10 MG/ML (PF) SYRINGE
PREFILLED_SYRINGE | INTRAVENOUS | Status: AC
  Filled 2021-05-29: qty 10

## 2021-05-29 MED ORDER — BUPIVACAINE HCL (PF) 0.25 % IJ SOLN
INTRAMUSCULAR | Status: DC | PRN
  Administered 2021-05-29 (×2): 15 mL

## 2021-05-29 MED ORDER — FENTANYL CITRATE (PF) 100 MCG/2ML IJ SOLN
INTRAMUSCULAR | Status: AC
  Filled 2021-05-29: qty 2

## 2021-05-29 MED ORDER — SCOPOLAMINE 1 MG/3DAYS TD PT72
1.0000 | MEDICATED_PATCH | TRANSDERMAL | Status: DC
  Administered 2021-05-29: 1.5 mg via TRANSDERMAL
  Filled 2021-05-29: qty 1

## 2021-05-29 MED ORDER — ONDANSETRON HCL 4 MG/2ML IJ SOLN
INTRAMUSCULAR | Status: DC | PRN
  Administered 2021-05-29: 4 mg via INTRAVENOUS

## 2021-05-29 MED ORDER — MIDAZOLAM HCL 2 MG/2ML IJ SOLN
INTRAMUSCULAR | Status: AC
  Filled 2021-05-29: qty 2

## 2021-05-29 MED ORDER — CELECOXIB 200 MG PO CAPS
200.0000 mg | ORAL_CAPSULE | Freq: Once | ORAL | Status: AC
  Administered 2021-05-29: 200 mg via ORAL
  Filled 2021-05-29: qty 1

## 2021-05-29 MED ORDER — KETOROLAC TROMETHAMINE 30 MG/ML IJ SOLN: 30.0000 mg | Freq: Four times a day (QID) | INTRAMUSCULAR | Status: DC | PRN

## 2021-05-29 MED ORDER — CHLORHEXIDINE GLUCONATE 0.12 % MT SOLN
OROMUCOSAL | Status: AC
  Administered 2021-05-29: 15 mL via OROMUCOSAL
  Filled 2021-05-29: qty 15

## 2021-05-29 MED ORDER — LACTATED RINGERS IV SOLN: INTRAVENOUS | Status: DC

## 2021-05-29 MED ORDER — CHLORHEXIDINE GLUCONATE CLOTH 2 % EX PADS: 6.0000 | MEDICATED_PAD | Freq: Once | CUTANEOUS | Status: DC

## 2021-05-29 MED ORDER — KETOROLAC TROMETHAMINE 30 MG/ML IJ SOLN
30.0000 mg | Freq: Four times a day (QID) | INTRAMUSCULAR | Status: AC
  Administered 2021-05-29: 30 mg via INTRAVENOUS
  Filled 2021-05-29: qty 1

## 2021-05-29 MED ORDER — OXYCODONE HCL 5 MG PO TABS
5.0000 mg | ORAL_TABLET | ORAL | Status: DC | PRN
  Administered 2021-05-29 – 2021-05-30 (×4): 10 mg via ORAL
  Filled 2021-05-29 (×4): qty 2

## 2021-05-29 MED ORDER — ROCURONIUM BROMIDE 10 MG/ML (PF) SYRINGE
PREFILLED_SYRINGE | INTRAVENOUS | Status: DC | PRN
Start: 2021-05-29 — End: 2021-05-29
  Administered 2021-05-29: 100 mg via INTRAVENOUS

## 2021-05-29 MED ORDER — DIPHENHYDRAMINE HCL 25 MG PO CAPS: 25.0000 mg | ORAL_CAPSULE | Freq: Four times a day (QID) | ORAL | Status: DC | PRN

## 2021-05-29 MED ORDER — AMISULPRIDE (ANTIEMETIC) 5 MG/2ML IV SOLN: 10.0000 mg | Freq: Once | INTRAVENOUS | Status: DC | PRN

## 2021-05-29 SURGICAL SUPPLY — 49 items
APL PRP STRL LF DISP 70% ISPRP (MISCELLANEOUS)
APL SKNCLS STERI-STRIP NONHPOA (GAUZE/BANDAGES/DRESSINGS) ×1
BAG COUNTER SPONGE SURGICOUNT (BAG) ×2 IMPLANT
BAG SPNG CNTER NS LX DISP (BAG) ×1
BENZOIN TINCTURE PRP APPL 2/3 (GAUZE/BANDAGES/DRESSINGS) ×2 IMPLANT
BINDER ABDOMINAL 12 ML 46-62 (SOFTGOODS) ×2 IMPLANT
BIOPATCH RED 1 DISK 7.0 (GAUZE/BANDAGES/DRESSINGS) ×1 IMPLANT
BLADE CLIPPER SURG (BLADE) ×1 IMPLANT
CANISTER SUCT 3000ML PPV (MISCELLANEOUS) ×2 IMPLANT
CHLORAPREP W/TINT 26 (MISCELLANEOUS) IMPLANT
COVER SURGICAL LIGHT HANDLE (MISCELLANEOUS) ×2 IMPLANT
DRAIN CHANNEL 19F RND (DRAIN) ×1 IMPLANT
DRAPE LAPAROSCOPIC ABDOMINAL (DRAPES) ×2 IMPLANT
DRSG PAD ABDOMINAL 8X10 ST (GAUZE/BANDAGES/DRESSINGS) ×1 IMPLANT
DRSG TEGADERM 4X4.75 (GAUZE/BANDAGES/DRESSINGS) ×2 IMPLANT
ELECT BLADE 6.5 EXT (BLADE) ×1 IMPLANT
ELECT CAUTERY BLADE 6.4 (BLADE) ×2 IMPLANT
ELECT REM PT RETURN 9FT ADLT (ELECTROSURGICAL) ×2
ELECTRODE REM PT RTRN 9FT ADLT (ELECTROSURGICAL) ×1 IMPLANT
EVACUATOR SILICONE 100CC (DRAIN) ×1 IMPLANT
GAUZE SPONGE 4X4 12PLY STRL (GAUZE/BANDAGES/DRESSINGS) ×1 IMPLANT
GLOVE SURG ENC MOIS LTX SZ7 (GLOVE) ×2 IMPLANT
GLOVE SURG UNDER POLY LF SZ6.5 (GLOVE) ×2 IMPLANT
GLOVE SURG UNDER POLY LF SZ7.5 (GLOVE) ×2 IMPLANT
GOWN STRL REUS W/ TWL LRG LVL3 (GOWN DISPOSABLE) ×2 IMPLANT
GOWN STRL REUS W/TWL LRG LVL3 (GOWN DISPOSABLE) ×6
KIT BASIN OR (CUSTOM PROCEDURE TRAY) ×2 IMPLANT
KIT TURNOVER KIT B (KITS) ×2 IMPLANT
MESH VENTRALIGHT ST 4X6IN (Mesh General) ×1 IMPLANT
NDL HYPO 25GX1X1/2 BEV (NEEDLE) ×1 IMPLANT
NEEDLE HYPO 25GX1X1/2 BEV (NEEDLE) ×2 IMPLANT
NS IRRIG 1000ML POUR BTL (IV SOLUTION) ×2 IMPLANT
PACK GENERAL/GYN (CUSTOM PROCEDURE TRAY) ×2 IMPLANT
PAD ARMBOARD 7.5X6 YLW CONV (MISCELLANEOUS) ×4 IMPLANT
PENCIL SMOKE EVACUATOR (MISCELLANEOUS) ×2 IMPLANT
STRIP CLOSURE SKIN 1/2X4 (GAUZE/BANDAGES/DRESSINGS) ×2 IMPLANT
SUT ETHILON 2 0 FS 18 (SUTURE) ×1 IMPLANT
SUT MNCRL AB 4-0 PS2 18 (SUTURE) ×2 IMPLANT
SUT NOVA NAB DX-16 0-1 5-0 T12 (SUTURE) ×3 IMPLANT
SUT NOVA NAB GS-21 0 18 T12 DT (SUTURE) ×2 IMPLANT
SUT SILK 3 0 SH CR/8 (SUTURE) ×1 IMPLANT
SUT VIC AB 3-0 SH 27 (SUTURE) ×4
SUT VIC AB 3-0 SH 27XBRD (SUTURE) ×1 IMPLANT
SYR CONTROL 10ML LL (SYRINGE) ×2 IMPLANT
TOWEL GREEN STERILE (TOWEL DISPOSABLE) ×2 IMPLANT
TOWEL GREEN STERILE FF (TOWEL DISPOSABLE) ×2 IMPLANT
TRAY FOLEY MTR SLVR 14FR STAT (SET/KITS/TRAYS/PACK) ×1 IMPLANT
TUBE CONNECTING 12X1/4 (SUCTIONS) ×1 IMPLANT
YANKAUER SUCT BULB TIP NO VENT (SUCTIONS) ×1 IMPLANT

## 2021-05-29 NOTE — Op Note (Signed)
Ventral Hernia Repair with Mesh Procedure Note  Indications: This is a 40 year old female who is status post 2 C-sections.  The most recent was August 2021.  Since that time, even as the wound was healing, she felt that she had a bulge in this area.  This has become slightly larger and is causing some discomfort.  She denies any obstructive symptoms.  She does have some intermittent constipation.  She denies any nausea or vomiting.  She brought this to the attention of her PCP Dr. Clelia Croft who obtained a CT scan.  This revealed a lower anterior abdominal wall hernia containing small bowel without sign of obstruction.  The hernia defect measures approximately 4.1 cm.  BMI 46  Pre-operative Diagnosis: Ventral incisional hernia  Post-operative Diagnosis: Ventral incisional hernia  Surgeon: Wynona Luna   Assistants: Resident - Susa Day, MD. I was personally present during the key and critical portions of this procedure and immediately available throughout the entire procedure, as documented in my operative note.   Anesthesia: General endotracheal anesthesia and TAP blocks  ASA Class: 2  Procedure Details  The patient was seen in the Holding Room. The risks, benefits, complications, treatment options, and expected outcomes were discussed with the patient. The possibilities of reaction to medication, pulmonary aspiration, perforation of viscus, bleeding, recurrent infection, the need for additional procedures, failure to diagnose a condition, and creating a complication requiring transfusion or operation were discussed with the patient. The patient concurred with the proposed plan, giving informed consent.  The site of surgery properly noted/marked. The patient was taken to the operating room, identified as Tina Diaz and the procedure verified as ventral hernia repair. A Time Out was held and the above information confirmed.  The patient was placed supine.  After establishing  general anesthesia, a Foley catheter was placed under sterile technique.  The abdomen was prepped with Chloraprep and draped in sterile fashion.  We made a vertical incision over the palpable hernia in the lower midline. Dissection was carried down to the hernia sac in the lower abdomen.  We opened the large hernia sac and reduced the small bowel.  There were several dense adhesions of the small bowel to the hernia sac, and these were taken down sharply.  Intact fascia was identified circumferentially around the 5 cm defect.  We used a Ventralight ST mesh cut to fit the fascial opening.and secured this to the fascia circumferentially with interrupted 0 Novofil sutures in an underlay fashion.  The fascial defect was reapproximated with interrupted figure-of-8 1 Novofil sutures.  The subcutaneous tissues were irrigated.  A drain was placed into the large hernia sac through a LLQ stab incision.  The hernia sac and deep subcutaneous tissues were closed with 3-0 Vicryl.   The skin incision was closed with a 4-0 subcuticular closure.  Steri-Strips were applied at the end of the operation.    Instrument, sponge, and needle counts were correct prior to closure and at the conclusion of the case.   Findings: 5 cm defect; large subcutaneous hernia sac  Estimated Blood Loss:  less than 50 mL         Drains: JP drain in subcutaneous hernia sac                      Complications:  None; patient tolerated the procedure well.         Disposition: PACU - hemodynamically stable.         Condition: stable  Wilmon Arms. Corliss Skains, MD, [facility]  General Surgery   05/29/2021 9:34 AM

## 2021-05-29 NOTE — Anesthesia Procedure Notes (Signed)
Anesthesia Regional Block: TAP block   Pre-Anesthetic Checklist: , timeout performed,  Correct Patient, Correct Site, Correct Laterality,  Correct Procedure, Correct Position, site marked,  Risks and benefits discussed,  Surgical consent,  Pre-op evaluation,  At surgeon's request and post-op pain management  Laterality: Left  Prep: chloraprep       Needles:  Injection technique: Single-shot  Needle Type: Echogenic Stimulator Needle     Needle Length: 10cm  Needle Gauge: 21     Additional Needles:   Procedures:,,,, ultrasound used (permanent image in chart),,    Narrative:  Start time: 05/29/2021 7:16 AM End time: 05/29/2021 7:24 AM Injection made incrementally with aspirations every 5 mL.  Performed by: Personally  Anesthesiologist: Heather Roberts, MD

## 2021-05-29 NOTE — Anesthesia Procedure Notes (Signed)
Procedure Name: Intubation Date/Time: 05/29/2021 7:44 AM Performed by: Rosiland Oz, CRNA Pre-anesthesia Checklist: Patient identified, Emergency Drugs available, Suction available, Patient being monitored and Timeout performed Patient Re-evaluated:Patient Re-evaluated prior to induction Oxygen Delivery Method: Circle system utilized Preoxygenation: Pre-oxygenation with 100% oxygen Induction Type: IV induction Ventilation: Mask ventilation without difficulty Laryngoscope Size: Miller and 2 Grade View: Grade I Tube type: Oral Tube size: 7.0 mm Number of attempts: 1 Airway Equipment and Method: Stylet Placement Confirmation: ETT inserted through vocal cords under direct vision, positive ETCO2 and breath sounds checked- equal and bilateral Secured at: 21 cm Tube secured with: Tape Dental Injury: Teeth and Oropharynx as per pre-operative assessment

## 2021-05-29 NOTE — Anesthesia Postprocedure Evaluation (Signed)
Anesthesia Post Note  Patient: Tina Diaz  Procedure(s) Performed: OPEN VENTRAL HERNIA REPAIR WITH MESH (Abdomen)     Patient location during evaluation: PACU Anesthesia Type: General Level of consciousness: sedated Pain management: pain level controlled Vital Signs Assessment: post-procedure vital signs reviewed and stable Respiratory status: spontaneous breathing and respiratory function stable Cardiovascular status: stable Postop Assessment: no apparent nausea or vomiting Anesthetic complications: no   No notable events documented.  Last Vitals:  Vitals:   05/29/21 1050 05/29/21 1157  BP: 115/66 108/70  Pulse: (!) 56 70  Resp: 12 18  Temp:  36.6 C  SpO2: 95% 98%    Last Pain:  Vitals:   05/29/21 1157  TempSrc: Oral  PainSc:                  [name]

## 2021-05-29 NOTE — Transfer of Care (Signed)
Immediate Anesthesia Transfer of Care Note  Patient: Tina Diaz  Procedure(s) Performed: OPEN VENTRAL HERNIA REPAIR WITH MESH (Abdomen)  Patient Location: PACU  Anesthesia Type:GA combined with regional for post-op pain  Level of Consciousness: drowsy and patient cooperative  Airway & Oxygen Therapy: Patient Spontanous Breathing  Post-op Assessment: Report given to RN and Post -op Vital signs reviewed and stable  Post vital signs: Reviewed and stable  Last Vitals:  Vitals Value Taken Time  BP 126/70 05/29/21 0951  Temp    Pulse 98 05/29/21 0953  Resp 10 05/29/21 0953  SpO2 89 % 05/29/21 0953  Vitals shown include unvalidated device data.  Last Pain:  Vitals:   05/29/21 0614  TempSrc:   PainSc: 0-No pain         Complications: No notable events documented.

## 2021-05-29 NOTE — Progress Notes (Signed)
Pt arrived to [facility] room 16 alert and oriented x4. Pain level 3. Bed in lowest position. Call light in reach. Will continue to monitor pt

## 2021-05-29 NOTE — Interval H&P Note (Signed)
History and Physical Interval Note:  05/29/2021 6:54 AM  Tina Diaz  has presented today for surgery, with the diagnosis of VENTRAL INCISIONAL HERNIA.  The various methods of treatment have been discussed with the patient and family. After consideration of risks, benefits and other options for treatment, the patient has consented to  Procedure(s) with comments: OPEN VENTRAL HERNIA REPAIR WITH MESH (N/A) - TAP BLOCK as a surgical intervention.  The patient's history has been reviewed, patient examined, no change in status, stable for surgery.  I have reviewed the patient's chart and labs.  Questions were answered to the patient's satisfaction.     Wynona Luna

## 2021-05-29 NOTE — Anesthesia Procedure Notes (Addendum)
Anesthesia Regional Block: TAP block   Pre-Anesthetic Checklist: , timeout performed,  Correct Patient, Correct Site, Correct Laterality,  Correct Procedure, Correct Position, site marked,  Risks and benefits discussed,  Surgical consent,  Pre-op evaluation,  At surgeon's request and post-op pain management  Laterality: Right  Prep: chloraprep       Needles:  Injection technique: Single-shot  Needle Type: Echogenic Stimulator Needle     Needle Length: 10cm  Needle Gauge: 21     Additional Needles:   Procedures:,,,, ultrasound used (permanent image in chart),,    Narrative:  Start time: 05/29/2021 7:08 AM End time: 05/29/2021 7:16 AM Injection made incrementally with aspirations every 5 mL.  Performed by: Personally  Anesthesiologist: Heather Roberts, MD

## 2021-05-30 ENCOUNTER — Encounter (HOSPITAL_COMMUNITY): Payer: Self-pay | Admitting: Surgery

## 2021-05-30 DIAGNOSIS — K439 Ventral hernia without obstruction or gangrene: Secondary | ICD-10-CM | POA: Diagnosis not present

## 2021-05-30 LAB — CBC
HCT: 36.5 % (ref 36.0–46.0)
Hemoglobin: 12 g/dL (ref 12.0–15.0)
MCH: 28.4 pg (ref 26.0–34.0)
MCHC: 32.9 g/dL (ref 30.0–36.0)
MCV: 86.3 fL (ref 80.0–100.0)
Platelets: 240 10*3/uL (ref 150–400)
RBC: 4.23 MIL/uL (ref 3.87–5.11)
RDW: 14.2 % (ref 11.5–15.5)
WBC: 10.5 10*3/uL (ref 4.0–10.5)
nRBC: 0 % (ref 0.0–0.2)

## 2021-05-30 LAB — BASIC METABOLIC PANEL
Anion gap: 10 (ref 5–15)
BUN: 11 mg/dL (ref 6–20)
CO2: 24 mmol/L (ref 22–32)
Calcium: 8.7 mg/dL — ABNORMAL LOW (ref 8.9–10.3)
Chloride: 103 mmol/L (ref 98–111)
Creatinine, Ser: 0.71 mg/dL (ref 0.44–1.00)
GFR, Estimated: >60 mL/min (ref >60)
Glucose, Bld: 138 mg/dL — ABNORMAL HIGH (ref 70–99)
Potassium: 3.9 mmol/L (ref 3.5–5.1)
Sodium: 137 mmol/L (ref 135–145)

## 2021-05-30 MED ORDER — CYCLOBENZAPRINE HCL 10 MG PO TABS: 10.0000 mg | ORAL_TABLET | Freq: Three times a day (TID) | ORAL | 0 refills | Status: AC | PRN

## 2021-05-30 MED ORDER — OXYCODONE HCL 5 MG PO TABS: 5.0000 mg | ORAL_TABLET | Freq: Four times a day (QID) | ORAL | 0 refills | Status: AC | PRN

## 2021-05-30 NOTE — Discharge Instructions (Signed)
[facility]      [facility] [phone]  OPEN ABDOMINAL SURGERY: POST OP INSTRUCTIONS  Always review your discharge instruction sheet given to you by the facility where your surgery was performed.  IF YOU HAVE DISABILITY OR FAMILY LEAVE FORMS, YOU MUST BRING THEM TO THE OFFICE FOR PROCESSING.  PLEASE DO NOT GIVE THEM TO YOUR DOCTOR.  A prescription for pain medication may be given to you upon discharge.  Take your pain medication as prescribed, if needed.  If narcotic pain medicine is not needed, then you may take acetaminophen (Tylenol) or ibuprofen (Advil) as needed. Take your usually prescribed medications unless otherwise directed. If you need a refill on your pain medication, please contact your pharmacy. They will contact our office to request authorization.  Prescriptions will not be filled after 5pm or on week-ends. You should follow a light diet the first few days after arrival home, such as soup and crackers, pudding, etc.unless your doctor has advised otherwise. A high-fiber, low fat diet can be resumed as tolerated.   Be sure to include lots of fluids daily. Most patients will experience some swelling and bruising on the chest and neck area.  Ice packs will help.  Swelling and bruising can take several days to resolve Most patients will experience some swelling and bruising in the area of the incision. Ice pack will help. Swelling and bruising can take several days to resolve..  It is common to experience some constipation if taking pain medication after surgery.  Increasing fluid intake and taking a stool softener will usually help or prevent this problem from occurring.  A mild laxative (Milk of Magnesia or Miralax) should be taken according to package directions if there are no bowel movements after 48 hours.  You may have steri-strips (small skin tapes) in place directly over the incision.  These strips should be left on the skin for 7-10 days.  If your surgeon used skin  glue on the incision, you may shower in 24 hours.  The glue will flake off over the next 2-3 weeks.  Any sutures or staples will be removed at the office during your follow-up visit. You may find that a light gauze bandage over your incision may keep your staples from being rubbed or pulled. You may shower and replace the bandage daily. ACTIVITIES:  You may resume regular (light) daily activities beginning the next day--such as daily self-care, walking, climbing stairs--gradually increasing activities as tolerated.  You may have sexual intercourse when it is comfortable.  Refrain from any heavy lifting or straining until approved by your doctor. You may drive when you no longer are taking prescription pain medication, you can comfortably wear a seatbelt, and you can safely maneuver your car and apply brakes Return to Work: ___________________________________ You should see your doctor in the office for a follow-up appointment approximately two weeks after your surgery.  Make sure that you call for this appointment within a day or two after you arrive home to insure a convenient appointment time. OTHER INSTRUCTIONS:  _____________________________________________________________ _____________________________________________________________  WHEN TO CALL YOUR DOCTOR: Fever over 101.0 Inability to urinate Nausea and/or vomiting Extreme swelling or bruising Continued bleeding from incision. Increased pain, redness, or drainage from the incision. Difficulty swallowing or breathing Muscle cramping or spasms. Numbness or tingling in hands or feet or around lips.  The clinic staff is available to answer your questions during regular business hours.  Please don't hesitate to call and ask to speak to one of   the nurses if you have concerns.  For further questions, please visit [email or web address]  

## 2021-05-30 NOTE — Discharge Summary (Signed)
Physician Discharge Summary  Patient ID: Tina Diaz MRN: [id number] DOB/AGE: 38/05/1981 37 y.o.  Admit date: 05/29/2021 Discharge date: 05/30/2021  Admission Diagnoses:  Ventral incisional hernia  Discharge Diagnoses: same Active Problems:   Ventral incisional hernia   Discharged Condition: good  Hospital Course: Open ventral hernia repair with mesh 05/29/21.  She did well overnight and is discharged today.  The drain is in the subcutaneous space  Treatments: surgery: ventral hernia repair with mesh  Discharge Exam: Blood pressure (!) 113/57, pulse 78, temperature 98.5 F (36.9 C), temperature source Oral, resp. rate 18, height 5\' 11"  (1.803 m), weight (!) 149.7 kg, SpO2 95 %, unknown if currently breastfeeding. General appearance: alert, cooperative, and no distress GI: soft, incisional tenderness Incision c/d/I; drain- serosanguinous  Disposition: Discharge disposition: 01-Home or Self Care       Discharge Instructions     Call MD for:  persistant nausea and vomiting   Complete by: As directed    Call MD for:  redness, tenderness, or signs of infection (pain, swelling, redness, odor or green/yellow discharge around incision site)   Complete by: As directed    Call MD for:  severe uncontrolled pain   Complete by: As directed    Call MD for:  temperature >100.4   Complete by: As directed    Diet general   Complete by: As directed    Discharge wound care:   Complete by: As directed    Abdominal binder PRN/ dry gauze over incision and around drain. Sponge baths only until drain is removed   Driving Restrictions   Complete by: As directed    Do not drive while taking pain medications   Increase activity slowly   Complete by: As directed       Allergies as of 05/30/2021       Reactions   Augmentin [amoxicillin-pot Clavulanate] Nausea And Vomiting   Abdominal pain Has patient had a PCN reaction causing immediate rash, facial/tongue/throat swelling,  SOB or lightheadedness with hypotension: No Has patient had a PCN reaction causing severe rash involving mucus membranes or skin necrosis: No Has patient had a PCN reaction that required hospitalization: No Has patient had a PCN reaction occurring within the last 10 years: No If all of the above answers are "NO", then may proceed with Cephalosporin use.        Medication List     TAKE these medications    acetaminophen 325 MG tablet Commonly known as: Tylenol Take 2 tablets (650 mg total) by mouth every 6 (six) hours as needed for mild pain (for pain scale < 4).   albuterol 108 (90 Base) MCG/ACT inhaler Commonly known as: VENTOLIN HFA Inhale 2 puffs into the lungs every 4 (four) hours as needed for wheezing or shortness of breath.   cetirizine 10 MG tablet Commonly known as: ZyrTEC Allergy Take 1 tablet (10 mg total) by mouth daily.   cyclobenzaprine 10 MG tablet Commonly known as: FLEXERIL Take 1 tablet (10 mg total) by mouth 3 (three) times daily as needed for muscle spasms. What changed: Another medication with the same name was added. Make sure you understand how and when to take each.   cyclobenzaprine 10 MG tablet Commonly known as: FLEXERIL Take 1 tablet (10 mg total) by mouth 3 (three) times daily as needed for muscle spasms. What changed: You were already taking a medication with the same name, and this prescription was added. Make sure you understand how and when to take each.  cyclobenzaprine 10 MG tablet Commonly known as: FLEXERIL Take 1 tablet (10 mg total) by mouth 3 (three) times daily as needed for muscle spasms. What changed: You were already taking a medication with the same name, and this prescription was added. Make sure you understand how and when to take each.   docusate sodium 100 MG capsule Commonly known as: Colace Take 1 capsule (100 mg total) by mouth 2 (two) times daily.   fluticasone 50 MCG/ACT nasal spray Commonly known as: FLONASE Place  2 sprays into both nostrils daily.   ibuprofen 200 MG tablet Commonly known as: ADVIL Take 400 mg by mouth every 8 (eight) hours as needed for moderate pain.   ibuprofen 600 MG tablet Commonly known as: ADVIL Take 1 tablet (600 mg total) by mouth every 6 (six) hours as needed.   oxyCODONE 5 MG immediate release tablet Commonly known as: Oxy IR/ROXICODONE Take 1 tablet (5 mg total) by mouth every 6 (six) hours as needed for severe pain. What changed: when to take this   sertraline 100 MG tablet Commonly known as: ZOLOFT Take 100 mg by mouth every evening.               Discharge Care Instructions  (From admission, onward)           Start     Ordered   05/30/21 0000  Discharge wound care:       Comments: Abdominal binder PRN/ dry gauze over incision and around drain. Sponge baths only until drain is removed   05/30/21 4196            Follow-up Information     Manus Rudd, MD Follow up in 1 week(s).   Specialty: General Surgery Contact information: [address] [place] [address] [phone]                 Signed: Wynona Luna 05/30/2021, 9:40 AM

## 2021-05-30 NOTE — Plan of Care (Signed)

## 2021-06-04 NOTE — H&P (Signed)
REFERRING PHYSICIAN:  Lupita Raider, MD   PROVIDER:  Lissa Morales, MD   MRN: [id number] DOB: 1981-06-23    Subjective    Chief Complaint: Hernia (Ventral hernia)       History of Present Illness: Tina Diaz is a 40 y.o. female who is seen today as an office consultation at the request of Dr. Clelia Croft for evaluation of Hernia (Ventral hernia) .     This is a 40 year old female who is status post 2 C-sections.  The most recent was August 2021.  Since that time, even as the wound was healing, she felt that she had a bulge in this area.  This has become slightly larger and is causing some discomfort.  She denies any obstructive symptoms.  She does have some intermittent constipation.  She denies any nausea or vomiting.  She brought this to the attention of her PCP Dr. Clelia Croft who obtained a CT scan.  This revealed a lower anterior abdominal wall hernia containing small bowel without sign of obstruction.  The hernia defect measures approximately 4.1 cm.   The patient reports losing about 45 pounds using the keto diet. Review of Systems: A complete review of systems was obtained from the patient.  I have reviewed this information and discussed as appropriate with the patient.  See HPI as well for other ROS.   Review of Systems  Constitutional: Negative.   HENT: Negative.   Eyes: Negative.   Respiratory: Negative.   Cardiovascular: Negative.   Gastrointestinal: Positive for constipation.  Genitourinary: Negative.   Musculoskeletal: Negative.   Skin: Negative.   Neurological: Negative.   Endo/Heme/Allergies: Negative.   Psychiatric/Behavioral: Positive for depression.        Medical History: Past Medical History  History reviewed. No pertinent past medical history.           Patient Active Problem List  Diagnosis   Ventral incisional hernia      Past Surgical History           Past Surgical History:  Procedure Laterality Date   CESAREAN SECTION       csection x  2            Allergies           Allergies  Allergen Reactions   Amoxicillin-Pot Clavulanate Nausea, Nausea And Vomiting and Vomiting      Abdominal pain Has patient had a PCN reaction causing immediate rash, facial/tongue/throat swelling, SOB or lightheadedness with hypotension: No Has patient had a PCN reaction causing severe rash involving mucus membranes or skin necrosis: No Has patient had a PCN reaction that required hospitalization: No Has patient had a PCN reaction occurring within the last 10 years: No If all of the above answers are "NO", then may proceed with Cephalosporin use.                     Current Outpatient Medications on File Prior to Visit  Medication Sig Dispense Refill   dextroamphetamine-amphetamine (ADDERALL XR) 10 MG XR capsule TAKE 1 CAPSULE BY MOUTH EVERY DAY IN THE MORNING FOR 30 DAYS       sertraline (ZOLOFT) 100 MG tablet sertraline 100 mg tablet  TAKE 1 TABLET BY MOUTH TWICE A DAY        No current facility-administered medications on file prior to visit.      Family History           Family History  Problem Relation Age of Onset  Diabetes Mother     Myocardial Infarction (Heart attack) Mother     High blood pressure (Hypertension) Mother          Social History         Tobacco Use  Smoking Status Never Smoker  Smokeless Tobacco Never Used      Social History  Social History             Socioeconomic History   Marital status: Married  Tobacco Use   Smoking status: Never Smoker   Smokeless tobacco: Never Used  Building services engineer Use: Never used  Substance and Sexual Activity   Alcohol use: Yes      Alcohol/week: 2.0 standard drinks      Types: 2 Glasses of wine per week   Drug use: Never   Sexual activity: Yes        Objective:           Vitals:    04/08/21 1023  BP: 124/80  Pulse: 104  Temp: 36.8 C (98.2 F)  SpO2: 96%  Weight: (!) 149.5 kg (329 lb 9.6 oz)  Height: 180.3 cm (5\' 11" )  PainSc: 0-No pain     Body mass index is 45.97 kg/m.   Physical Exam    Constitutional:  WDWN in NAD, conversant, no obvious deformities; lying in bed comfortably Eyes:  Pupils equal, round; sclera anicteric; moist conjunctiva; no lid lag HENT:  Oral mucosa moist; good dentition  Neck:  No masses palpated, trachea midline; no thyromegaly Lungs:  CTA bilaterally; normal respiratory effort Breasts:  symmetric, no nipple changes; no palpable masses or lymphadenopathy on either side CV:  Regular rate and rhythm; no murmurs; extremities well-perfused with no edema Abd:  +bowel sounds, soft, non-tender, no palpable organomegaly; no palpable hernias Musc:  Unable to assess gait; no apparent clubbing or cyanosis in extremities Lymphatic:  No palpable cervical or axillary lymphadenopathy Skin:  Warm, dry; no sign of jaundice Psychiatric - alert and oriented x 4; calm mood and affect     Labs, Imaging and Diagnostic Testing:   CLINICAL DATA:  Lower abdominal pain   EXAM: CT ABDOMEN AND PELVIS WITH CONTRAST   TECHNIQUE: Multidetector CT imaging of the abdomen and pelvis was performed using the standard protocol following bolus administration of intravenous contrast.   CONTRAST:  ISOVUE-300 IOPAMIDOL (ISOVUE-300) INJECTION 61%   COMPARISON:  None.   FINDINGS: Lower chest: No acute abnormality.   Hepatobiliary: No focal liver abnormality is seen. No gallstones, gallbladder wall thickening, or biliary dilatation.   Pancreas: Unremarkable. No pancreatic ductal dilatation or surrounding inflammatory changes.   Spleen: Normal in size without focal abnormality.   Adrenals/Urinary Tract: Adrenal glands are within normal limits. Kidneys demonstrate a normal enhancement pattern bilaterally. No renal calculi or obstructive changes are seen. Ureters are within normal limits. Bladder is decompressed.   Stomach/Bowel: Colon shows no obstructive or inflammatory changes. The appendix is within normal  limits. Small bowel shows no obstructive changes but the majority of the small bowel is herniated through an anterior abdominal wall defect which measures approximately 4.1 cm. Stomach is within normal limits.   Vascular/Lymphatic: No significant vascular findings are present. No enlarged abdominal or pelvic lymph nodes.   Reproductive: Uterus and bilateral adnexa are unremarkable. IUD is noted in place.   Other: No ascites is noted. Large anterior abdominal wall hernia is noted midline with multiple loops of small bowel within. No evidence of incarceration.   Musculoskeletal: No acute  or significant osseous findings.   IMPRESSION: Anterior abdominal wall hernia with small bowel within. No incarceration is noted.   No other focal abnormality is noted.     Electronically Signed   By: Alcide Clever M.D.   On: 02/24/2021 18:24   Assessment and Plan:  Diagnoses and all orders for this visit:   Ventral incisional hernia     We discussed the options for management of this hernia.  We discussed laparoscopic versus open repairs.  She has a relatively large defect with a very large hernia sac.  I recommend open repair which will allow [place] to close the fascial defect over the mesh and we can also attempt to either drain or close the resulting dead space in the subcutaneous tissues.  She is in agreement with this plan.   Open ventral hernia repair with mesh.The surgical procedure has been discussed with the patient.  Potential risks, benefits, alternative treatments, and expected outcomes have been explained.  All of the patient's questions at this time have been answered.  The likelihood of reaching the patient's treatment goal is good.  The patient understand the proposed surgical procedure and wishes to proceed.    Wilmon Arms. Corliss Skains, MD, [facility] Surgery  General Surgery   05/28/2021 3:36 PM

## 2021-06-16 IMAGING — US US RENAL
1 series · 15 of 25 positions shown · non-contrast
Comparison: None available.

CLINICAL DATA: Initial evaluation for acute right flank pain.
Currently pregnant.

EXAM:
RENAL / URINARY TRACT ULTRASOUND COMPLETE

[Series 1: us renal · 15 of 34 slices shown]
[im 1/34]
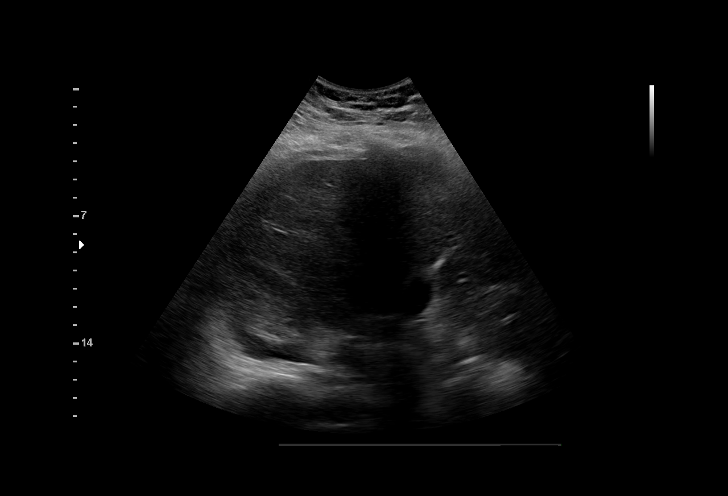
[im 3/34]
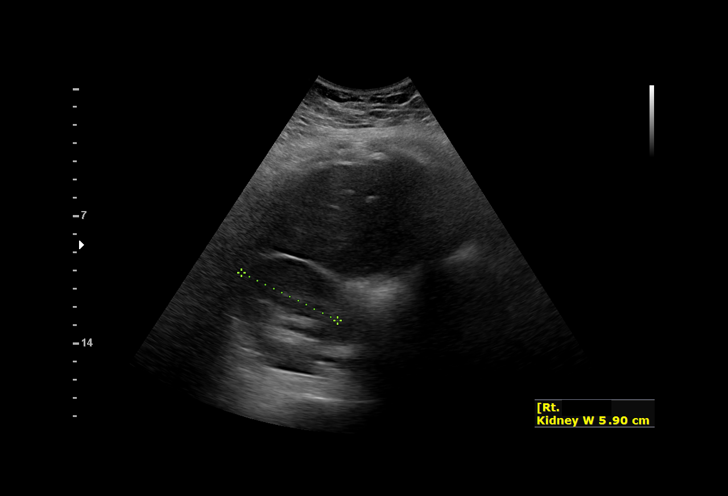
[im 6/34]
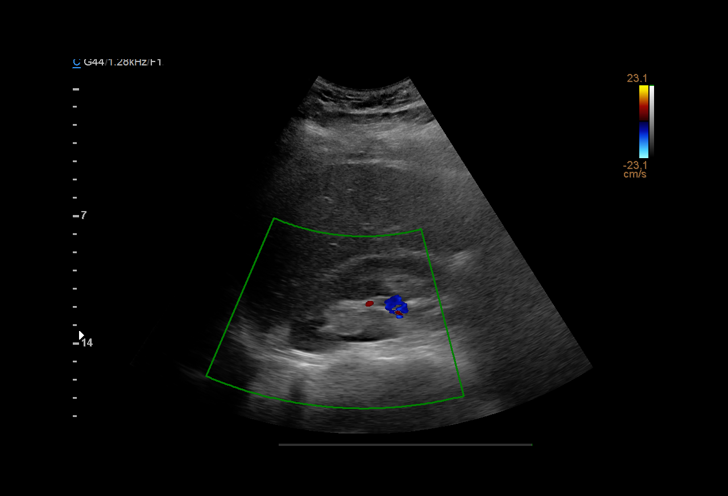
[im 7/34]
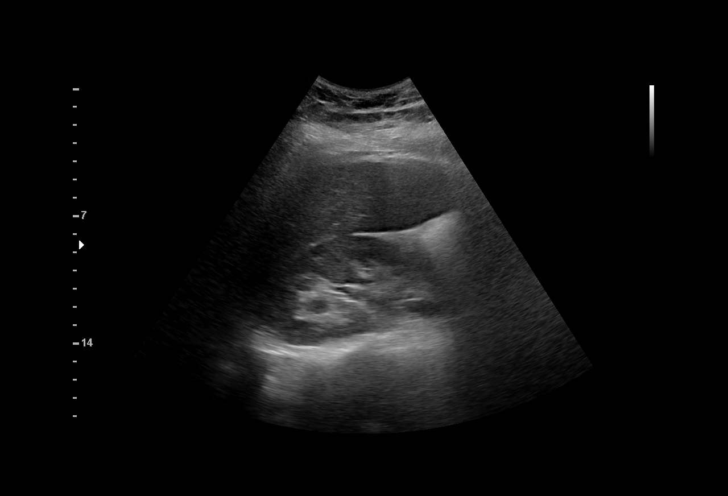
[im 10/34]
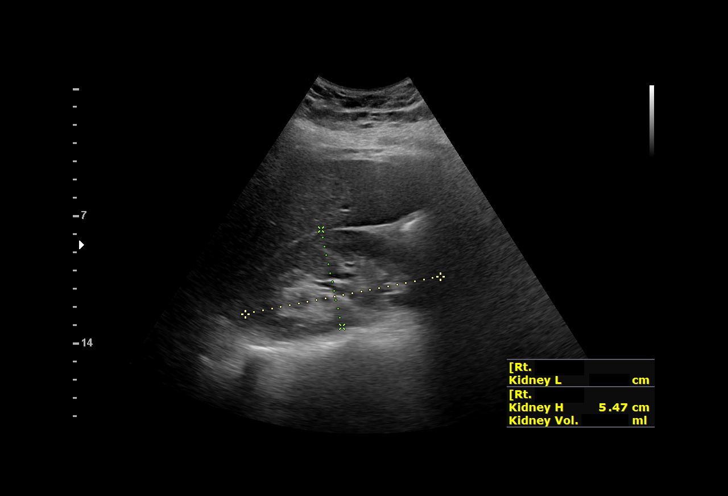
[im 13/34]
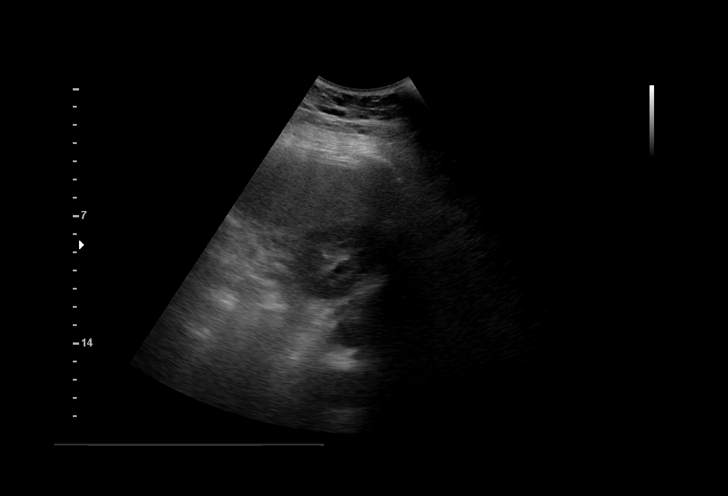
[im 14/34]
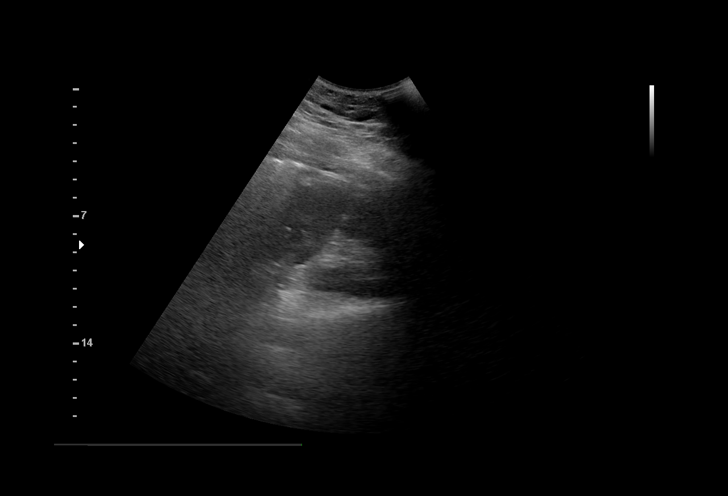
[im 17/34]
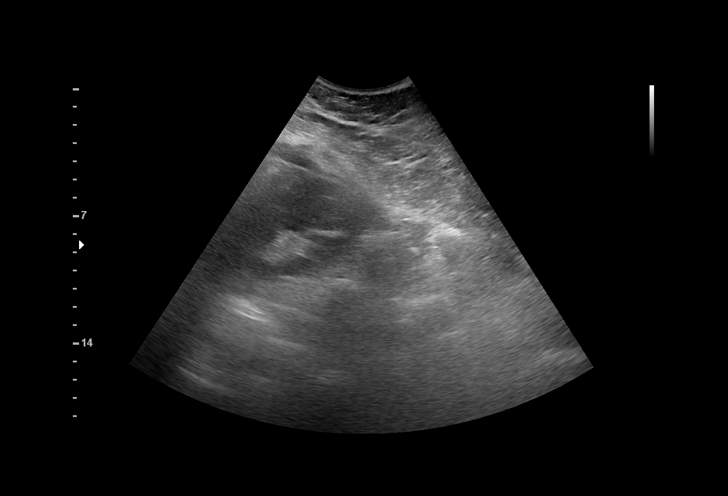
[im 20/34]
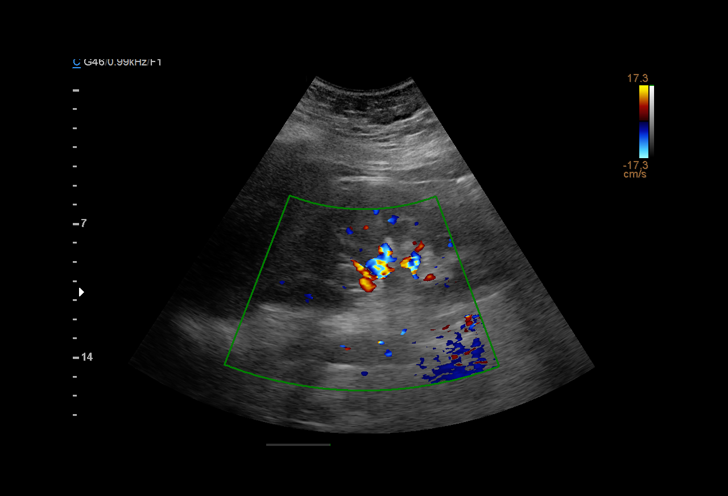
[im 21/34]
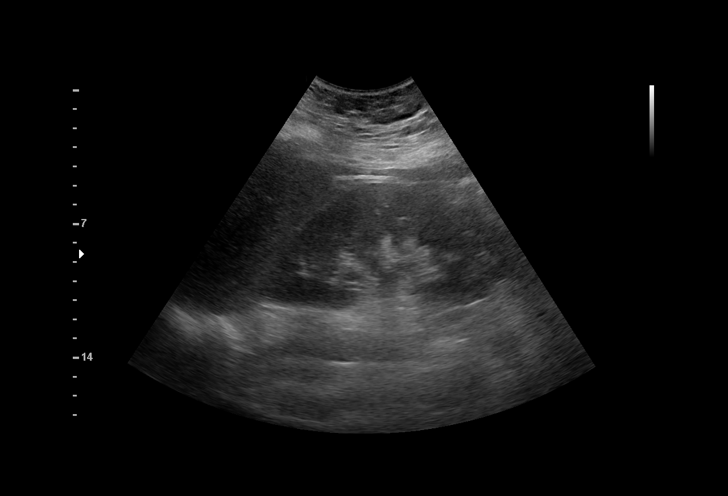
[im 24/34]
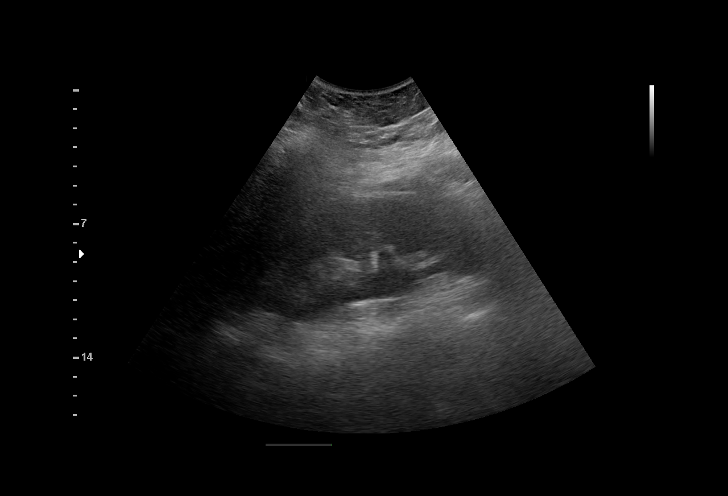
[im 27/34]
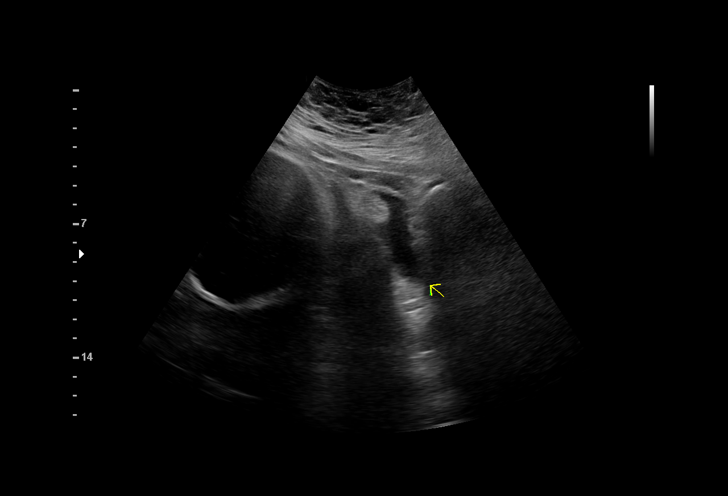
[im 28/34]
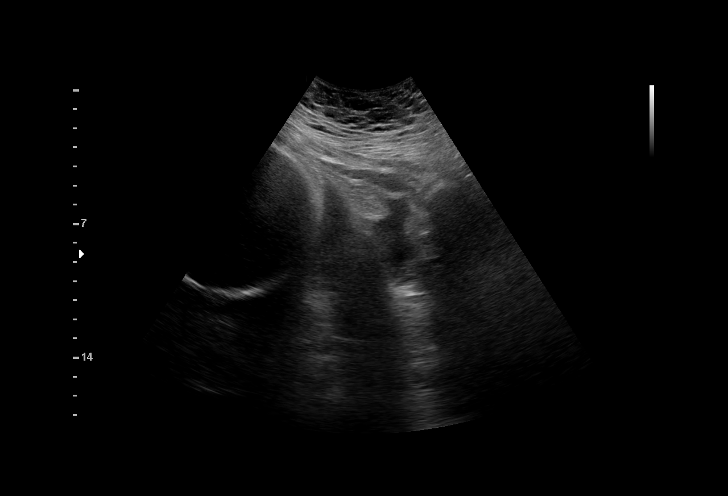
[im 31/34]
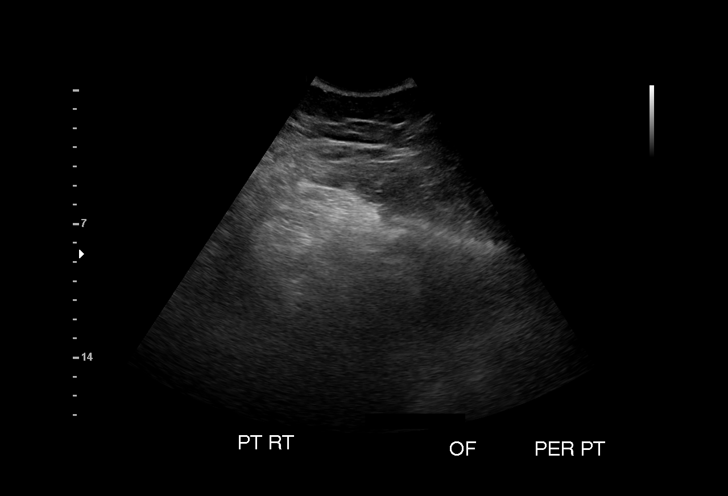
[im 34/34]
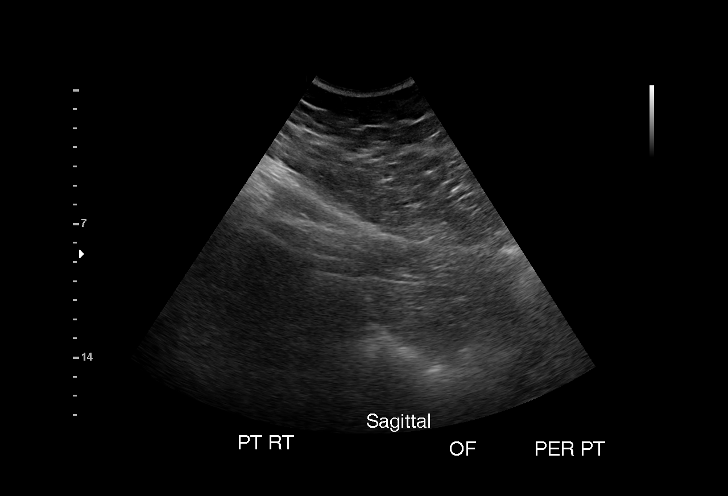

[15 of 25 positions shown; findings below may reference images not displayed]

FINDINGS: Right Kidney:

Renal measurements: 11.4 x 5.3 x 5.9 cm = volume: 186 mL . Renal
echogenicity within normal limits. No shadowing echogenic calculi
identified. Mild asymmetric fullness of the right renal collecting
system without overt hydronephrosis. No focal renal mass.

Left Kidney:

Renal measurements: 12.7 x 5.5 x 6.0 cm = volume: 218 mL. Renal
echogenicity within normal limits. No nephrolithiasis or
hydronephrosis. No focal renal mass.

Bladder:

Bladder largely decompressed and not well assessed.

Other:

Gravid uterus partially visualized.
IMPRESSION: 1. Mild asymmetric fullness of the right renal collecting system
without overt hydronephrosis, likely related to current pregnancy
status.
2. Otherwise normal renal ultrasound. No sonographic evidence for
nephrolithiasis.

## 2021-06-17 IMAGING — CR DG CHEST 2V
2 series · 2 of 2 positions shown · non-contrast
Comparison: 09/21/2005

CLINICAL DATA: Cough for 1 week

EXAM:
CHEST - 2 VIEW

[chest pa]
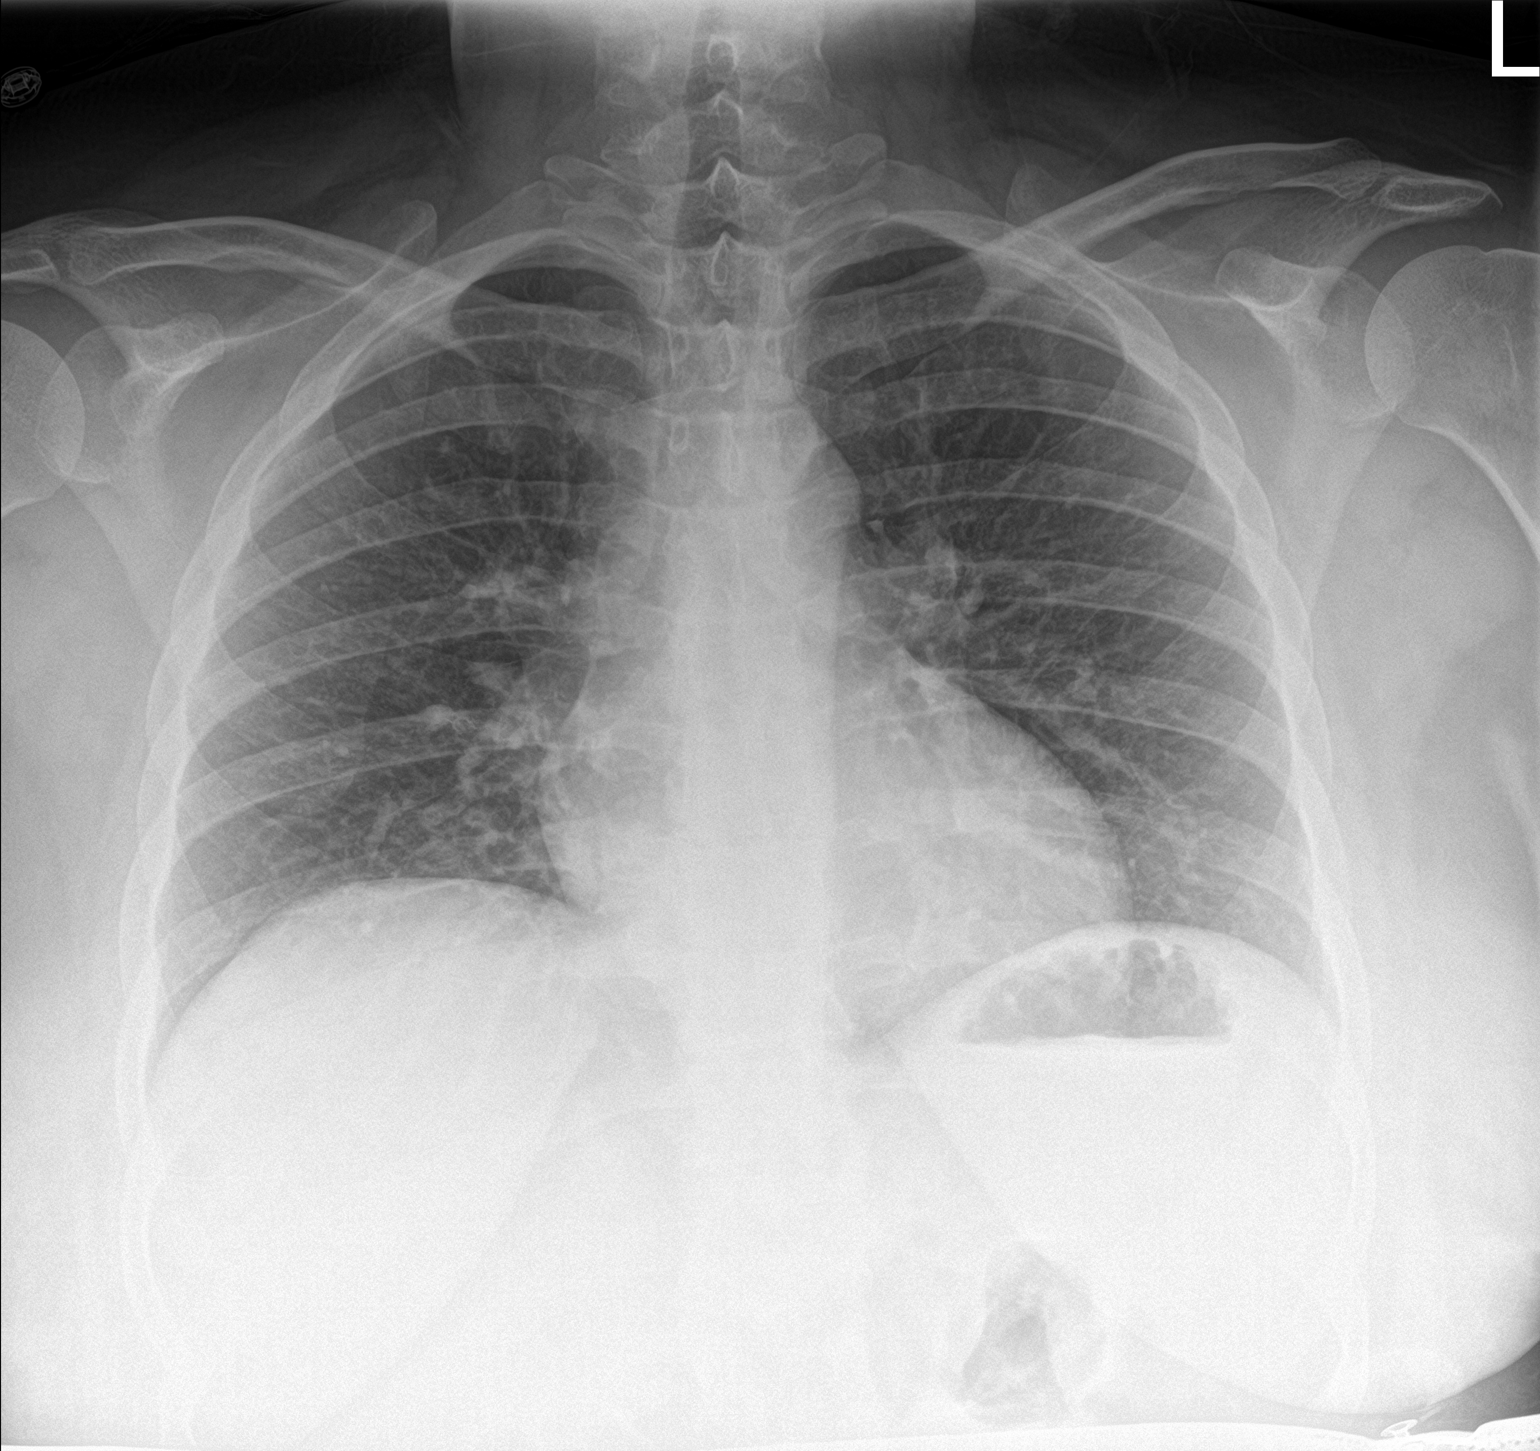

[chest lat]
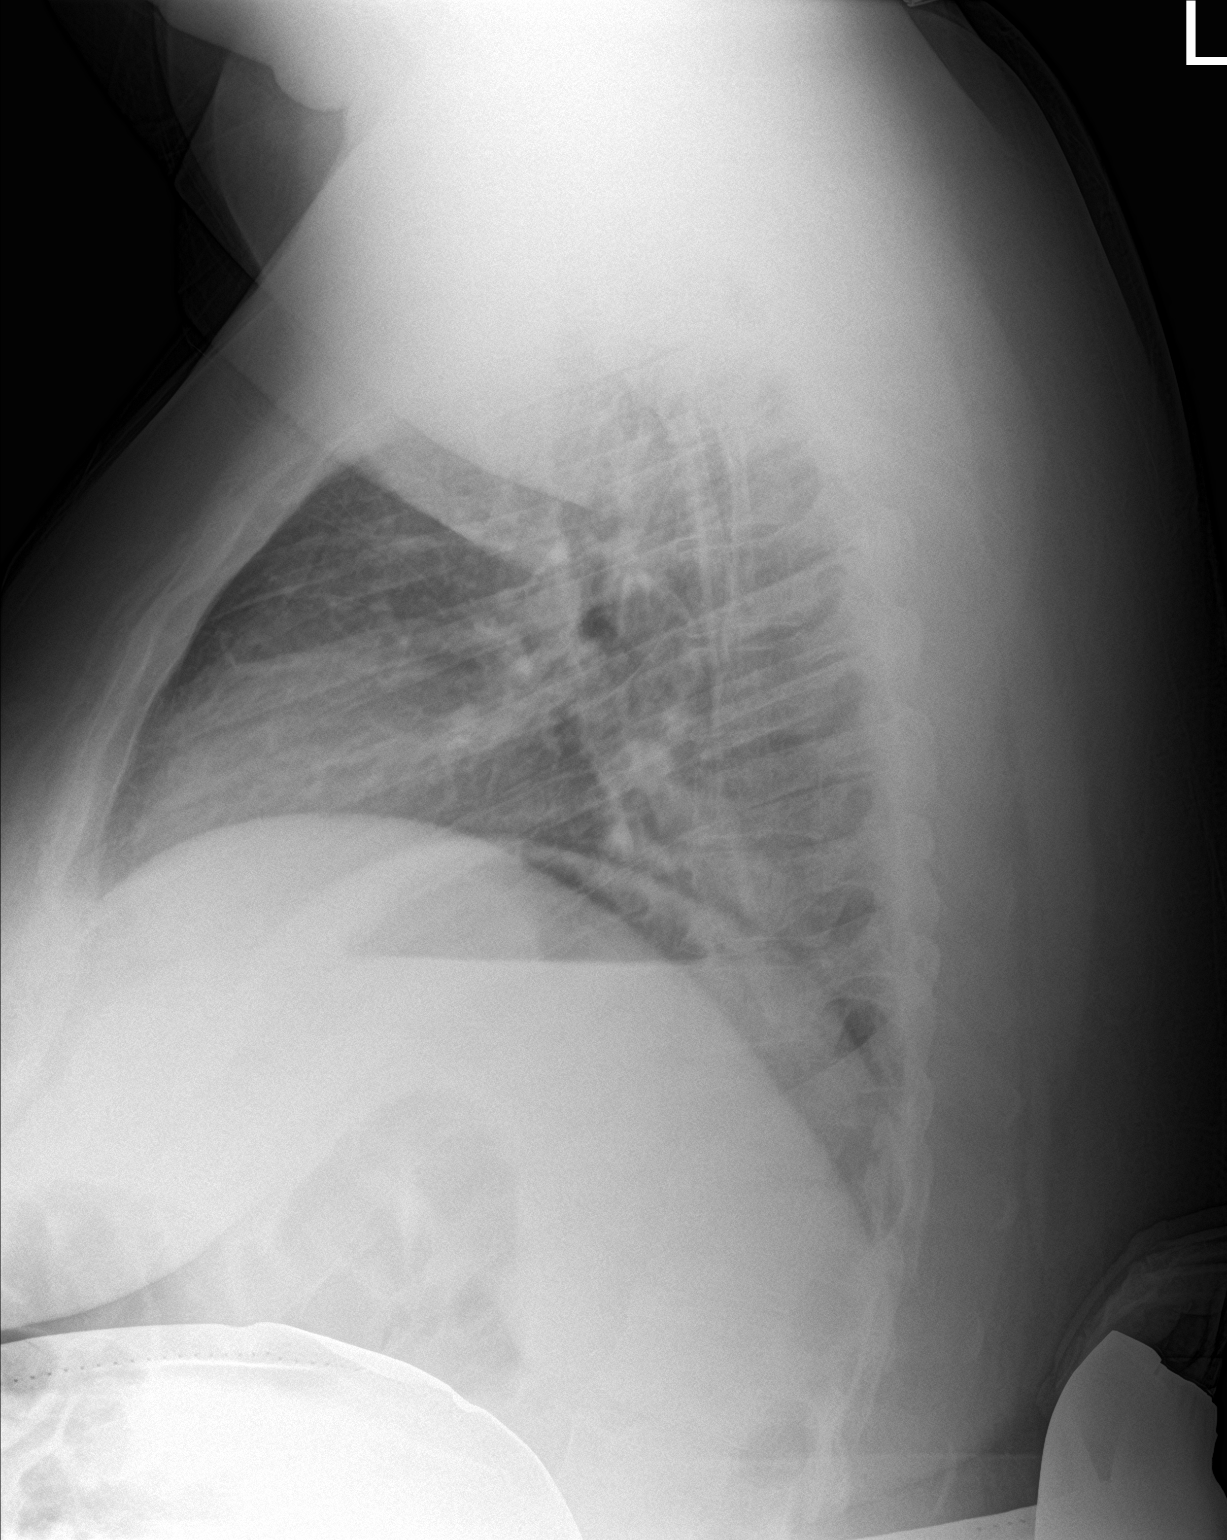

[2 of 2 positions shown; findings below may reference images not displayed]

FINDINGS: The heart size and mediastinal contours are within normal limits.
Both lungs are clear. The visualized skeletal structures are
unremarkable.
IMPRESSION: No active cardiopulmonary disease.
